# Patient Record
Sex: Female | Born: 1940 | Race: White | Hispanic: No | Marital: Married | Smoking: Never smoker
Health system: Southern US, Community
[De-identification: ages and names within clinical notes are randomized; demographics above are authoritative.]

## PROBLEM LIST (undated history)

## (undated) DIAGNOSIS — F329 Major depressive disorder, single episode, unspecified: Secondary | ICD-10-CM

## (undated) DIAGNOSIS — F32A Depression, unspecified: Secondary | ICD-10-CM

## (undated) DIAGNOSIS — B019 Varicella without complication: Secondary | ICD-10-CM

## (undated) DIAGNOSIS — G4700 Insomnia, unspecified: Secondary | ICD-10-CM

## (undated) DIAGNOSIS — J45909 Unspecified asthma, uncomplicated: Secondary | ICD-10-CM

## (undated) DIAGNOSIS — I10 Essential (primary) hypertension: Secondary | ICD-10-CM

## (undated) DIAGNOSIS — F419 Anxiety disorder, unspecified: Secondary | ICD-10-CM

## (undated) DIAGNOSIS — T7840XA Allergy, unspecified, initial encounter: Secondary | ICD-10-CM

## (undated) DIAGNOSIS — K5190 Ulcerative colitis, unspecified, without complications: Secondary | ICD-10-CM

## (undated) HISTORY — DX: Varicella without complication: B01.9

## (undated) HISTORY — DX: Allergy, unspecified, initial encounter: T78.40XA

## (undated) HISTORY — PX: REPLACEMENT UNICONDYLAR JOINT KNEE: SUR1227

## (undated) HISTORY — DX: Essential (primary) hypertension: I10

## (undated) HISTORY — DX: Unspecified asthma, uncomplicated: J45.909

## (undated) HISTORY — DX: Ulcerative colitis, unspecified, without complications: K51.90

---

## 1998-07-01 HISTORY — PX: ABDOMINAL HYSTERECTOMY: SHX81

## 2011-07-02 HISTORY — PX: JOINT REPLACEMENT: SHX530

## 2013-08-09 DIAGNOSIS — J45901 Unspecified asthma with (acute) exacerbation: Secondary | ICD-10-CM | POA: Diagnosis not present

## 2013-09-06 DIAGNOSIS — F411 Generalized anxiety disorder: Secondary | ICD-10-CM | POA: Diagnosis not present

## 2013-09-06 DIAGNOSIS — K5289 Other specified noninfective gastroenteritis and colitis: Secondary | ICD-10-CM | POA: Diagnosis not present

## 2013-12-22 DIAGNOSIS — K5190 Ulcerative colitis, unspecified, without complications: Secondary | ICD-10-CM | POA: Diagnosis not present

## 2013-12-22 DIAGNOSIS — K5289 Other specified noninfective gastroenteritis and colitis: Secondary | ICD-10-CM | POA: Diagnosis not present

## 2013-12-22 DIAGNOSIS — K644 Residual hemorrhoidal skin tags: Secondary | ICD-10-CM | POA: Diagnosis not present

## 2013-12-22 DIAGNOSIS — I10 Essential (primary) hypertension: Secondary | ICD-10-CM | POA: Diagnosis not present

## 2014-01-26 DIAGNOSIS — K5190 Ulcerative colitis, unspecified, without complications: Secondary | ICD-10-CM | POA: Diagnosis not present

## 2014-02-09 DIAGNOSIS — K633 Ulcer of intestine: Secondary | ICD-10-CM | POA: Diagnosis not present

## 2014-02-09 DIAGNOSIS — K5100 Ulcerative (chronic) pancolitis without complications: Secondary | ICD-10-CM | POA: Diagnosis not present

## 2014-02-09 DIAGNOSIS — K5190 Ulcerative colitis, unspecified, without complications: Secondary | ICD-10-CM | POA: Diagnosis not present

## 2014-02-09 DIAGNOSIS — K5150 Left sided colitis without complications: Secondary | ICD-10-CM | POA: Diagnosis not present

## 2014-02-09 DIAGNOSIS — K920 Hematemesis: Secondary | ICD-10-CM | POA: Diagnosis not present

## 2014-02-22 DIAGNOSIS — K5190 Ulcerative colitis, unspecified, without complications: Secondary | ICD-10-CM | POA: Diagnosis not present

## 2014-02-22 DIAGNOSIS — F3289 Other specified depressive episodes: Secondary | ICD-10-CM | POA: Diagnosis not present

## 2014-02-22 DIAGNOSIS — F329 Major depressive disorder, single episode, unspecified: Secondary | ICD-10-CM | POA: Diagnosis not present

## 2014-03-04 DIAGNOSIS — I10 Essential (primary) hypertension: Secondary | ICD-10-CM | POA: Diagnosis not present

## 2014-03-04 DIAGNOSIS — K5190 Ulcerative colitis, unspecified, without complications: Secondary | ICD-10-CM | POA: Diagnosis not present

## 2014-03-16 DIAGNOSIS — K5190 Ulcerative colitis, unspecified, without complications: Secondary | ICD-10-CM | POA: Diagnosis not present

## 2014-03-29 DIAGNOSIS — R21 Rash and other nonspecific skin eruption: Secondary | ICD-10-CM | POA: Diagnosis not present

## 2014-05-04 DIAGNOSIS — F331 Major depressive disorder, recurrent, moderate: Secondary | ICD-10-CM | POA: Diagnosis not present

## 2014-05-17 DIAGNOSIS — K51919 Ulcerative colitis, unspecified with unspecified complications: Secondary | ICD-10-CM | POA: Diagnosis not present

## 2014-06-07 DIAGNOSIS — I10 Essential (primary) hypertension: Secondary | ICD-10-CM | POA: Diagnosis present

## 2014-06-07 DIAGNOSIS — R092 Respiratory arrest: Secondary | ICD-10-CM | POA: Diagnosis not present

## 2014-06-07 DIAGNOSIS — K5190 Ulcerative colitis, unspecified, without complications: Secondary | ICD-10-CM | POA: Diagnosis present

## 2014-06-07 DIAGNOSIS — J4552 Severe persistent asthma with status asthmaticus: Secondary | ICD-10-CM | POA: Diagnosis not present

## 2014-06-07 DIAGNOSIS — N390 Urinary tract infection, site not specified: Secondary | ICD-10-CM | POA: Diagnosis not present

## 2014-06-07 DIAGNOSIS — E872 Acidosis: Secondary | ICD-10-CM | POA: Diagnosis not present

## 2014-06-07 DIAGNOSIS — J45902 Unspecified asthma with status asthmaticus: Secondary | ICD-10-CM | POA: Diagnosis not present

## 2014-06-07 DIAGNOSIS — E860 Dehydration: Secondary | ICD-10-CM | POA: Diagnosis present

## 2014-06-14 DIAGNOSIS — J45909 Unspecified asthma, uncomplicated: Secondary | ICD-10-CM | POA: Diagnosis not present

## 2014-06-15 DIAGNOSIS — Z09 Encounter for follow-up examination after completed treatment for conditions other than malignant neoplasm: Secondary | ICD-10-CM | POA: Diagnosis not present

## 2014-06-15 DIAGNOSIS — J45909 Unspecified asthma, uncomplicated: Secondary | ICD-10-CM | POA: Diagnosis not present

## 2014-08-25 DIAGNOSIS — I10 Essential (primary) hypertension: Secondary | ICD-10-CM | POA: Diagnosis not present

## 2014-08-25 DIAGNOSIS — J45909 Unspecified asthma, uncomplicated: Secondary | ICD-10-CM | POA: Diagnosis not present

## 2014-09-26 DIAGNOSIS — J45901 Unspecified asthma with (acute) exacerbation: Secondary | ICD-10-CM | POA: Diagnosis not present

## 2014-09-26 DIAGNOSIS — J069 Acute upper respiratory infection, unspecified: Secondary | ICD-10-CM | POA: Diagnosis not present

## 2014-10-11 DIAGNOSIS — J45909 Unspecified asthma, uncomplicated: Secondary | ICD-10-CM | POA: Diagnosis not present

## 2014-12-06 ENCOUNTER — Encounter: Payer: Self-pay | Admitting: Primary Care

## 2014-12-06 ENCOUNTER — Ambulatory Visit (INDEPENDENT_AMBULATORY_CARE_PROVIDER_SITE_OTHER): Payer: Medicare Other | Admitting: Primary Care

## 2014-12-06 VITALS — BP 148/88 | HR 64 | Temp 98.3°F | Ht 62.5 in | Wt 122.4 lb

## 2014-12-06 DIAGNOSIS — M62838 Other muscle spasm: Secondary | ICD-10-CM

## 2014-12-06 DIAGNOSIS — M6248 Contracture of muscle, other site: Secondary | ICD-10-CM | POA: Diagnosis not present

## 2014-12-06 DIAGNOSIS — K5190 Ulcerative colitis, unspecified, without complications: Secondary | ICD-10-CM

## 2014-12-06 DIAGNOSIS — M542 Cervicalgia: Secondary | ICD-10-CM

## 2014-12-06 DIAGNOSIS — I10 Essential (primary) hypertension: Secondary | ICD-10-CM

## 2014-12-06 DIAGNOSIS — J45909 Unspecified asthma, uncomplicated: Secondary | ICD-10-CM

## 2014-12-06 DIAGNOSIS — G8929 Other chronic pain: Secondary | ICD-10-CM | POA: Insufficient documentation

## 2014-12-06 MED ORDER — TIZANIDINE HCL 4 MG PO TABS: 4.0000 mg | ORAL_TABLET | Freq: Four times a day (QID) | ORAL | Status: DC | PRN

## 2014-12-06 NOTE — Progress Notes (Signed)
Subjective:    Patient ID: Katelyn Dillon, female    DOB: 1940-09-21, 74 y.o.   MRN: [id number]  HPI  Mr. Katelyn Dillon is a 74 year old female who presents today to establish care and discuss the problems mentioned below. Will obtain old records.  1) Asthma: Diagnosed during childhood. She has an albuterol inhaler that she's used 2-3 times in the past 3 weeks and will do nebulized treatments twice daily as directed per prior pulmonologist (cannot remember names of meds). She will typically feel shortness of breath without wheezing. She had pulmonary function testing in March 2016 and reports results were stable without change. Will obtain those records.  2) Hypertension: Diagnosed years ago. She is managed on amlodipine-benazepril 10-20 mg daily. She reports BP check in March was normal. Denies chest pain, headaches, shortness of breath. She reports feeling anxious sometimes in doctors offices.  3) Ulcerative Colitis: Diagnosed 30+ years ago and is managed on mesalamine 1.2 tablets. Has colonoscopy's every 2-3 years. Last one was 2 years ago. Denies recent flare-up's.   Review of Systems  Constitutional: Negative for unexpected weight change.  HENT: Negative for rhinorrhea.   Respiratory: Negative for cough and shortness of breath.   Cardiovascular: Negative for chest pain.  Gastrointestinal: Negative for diarrhea and constipation.  Genitourinary: Negative for dysuria and frequency.  Skin: Negative for rash.  Allergic/Immunologic: Positive for environmental allergies.  Neurological: Negative for headaches.  Psychiatric/Behavioral:       Denies concerns for anxiety or depression.       Past Medical History  Diagnosis Date  . Asthma   . Chicken pox   . Allergy   . Hypertension   . Ulcerative colitis     History   Social History  . Marital Status: Single    Spouse Name: N/A  . Number of Children: N/A  . Years of Education: N/A   Occupational History  . Not on file.   Social  History Main Topics  . Smoking status: Never Smoker   . Smokeless tobacco: Not on file  . Alcohol Use: 0.0 oz/week    0 Standard drinks or equivalent per week     Comment: social  . Drug Use: Not on file  . Sexual Activity: Not on file   Other Topics Concern  . Not on file   Social History Narrative   Widowed. In a relationship.   Three children.   Recently moved from [place].   Enjoys working outdoors, sewing.    Past Surgical History  Procedure Laterality Date  . Abdominal hysterectomy  2000    Family History  Problem Relation Age of Onset  . Hypertension Mother   . Hypertension Father     No Known Allergies  No current outpatient prescriptions on file prior to visit.   No current facility-administered medications on file prior to visit.    BP 148/88 mmHg  Pulse 64  Temp(Src) 98.3 F (36.8 C) (Oral)  Ht 5' 2.5" (1.588 m)  Wt 122 lb 6.4 oz (55.52 kg)  BMI 22.02 kg/m2  SpO2 98%    Objective:   Physical Exam  Constitutional: She is oriented to person, place, and time. She appears well-nourished.  Cardiovascular: Normal rate and regular rhythm.   Pulmonary/Chest: Effort normal and breath sounds normal.  Abdominal: Soft. Bowel sounds are normal. There is no tenderness.  Neurological: She is alert and oriented to person, place, and time.  Skin: Skin is warm and dry.  Psychiatric: She has a normal  mood and affect.          Assessment & Plan:

## 2014-12-06 NOTE — Assessment & Plan Note (Signed)
Stable. Managed on mesalamine 1.2 mg tablets No recent flare-up's. Due for repeat colonoscopy per patient, will obtain old records and review.

## 2014-12-06 NOTE — Assessment & Plan Note (Signed)
Managed on Lotrel 10/20 mg. Stable in office today per guidelines. She reports her pressures are typically lower but she's been going through stress recently with the move from [place]. Will continue to monitor.

## 2014-12-06 NOTE — Assessment & Plan Note (Signed)
Present since neck injury years ago. Uses muscle relaxer's sparingly PRN. Refills provided with drowsiness precautions.

## 2014-12-06 NOTE — Patient Instructions (Signed)
Please schedule a physical with me in mid August. You will also schedule a lab only appointment one week prior. We will discuss your lab results during your physical. It was a pleasure to meet you today! Please don't hesitate to call me with any questions. Welcome to [facility]!

## 2014-12-06 NOTE — Progress Notes (Signed)
Pre visit review using our clinic review tool, if applicable. No additional management support is needed unless otherwise documented below in the visit note. 

## 2014-12-06 NOTE — Assessment & Plan Note (Signed)
Present since childhood. Managed on neb treatments which she does twice daily (cannot remember meds) and albuterol inhaler PRN. Last PFT's were in March 2016, will obtain records. Will continue to monitor.

## 2015-01-09 ENCOUNTER — Telehealth: Payer: Self-pay

## 2015-01-09 NOTE — Telephone Encounter (Signed)
Received faxed paperwork and am happy to complete; however, there is no documentation of the exact nebulized medication. Vallarie Mare, will you find out what she's using at home? Thanks.

## 2015-01-09 NOTE — Telephone Encounter (Signed)
Watkins left v/m requesting status of faxed request for neb solution requested on 01/08/15. [facility] request cb [phone].

## 2015-01-09 NOTE — Telephone Encounter (Signed)
Found the faxed paperwork. Placed in [place] for completion. Will fax when she is finish.

## 2015-01-10 ENCOUNTER — Telehealth: Payer: Self-pay | Admitting: Primary Care

## 2015-01-10 NOTE — Telephone Encounter (Signed)
Form completed. Placed in [place] for faxing.

## 2015-01-10 NOTE — Telephone Encounter (Signed)
Called and spoken to patient. Patient is taking the neb for her asthma and BID. Patient takes Brovana 54mg/2ml and ipratropium bromide 0.513m

## 2015-01-11 ENCOUNTER — Ambulatory Visit: Payer: Self-pay | Admitting: Nurse Practitioner

## 2015-01-23 ENCOUNTER — Telehealth: Payer: Self-pay

## 2015-01-23 NOTE — Telephone Encounter (Signed)
Called and spoke with patient, and notified them that they were due for a Mammogram. Patient states that she is leaving town until the middle of August, and has a physical shortly after she returns home. Patietn states that she will schedule an appointment after her physical.

## 2015-01-25 ENCOUNTER — Other Ambulatory Visit: Payer: Self-pay | Admitting: Primary Care

## 2015-01-25 NOTE — Telephone Encounter (Signed)
Refill request. Last prescribed  7 weeks ago on 12/06/2014  tizanidine (ZANAFLEX) 4 MG tablet Dispense: 30 tablet   Refills: 2    There is also a note that stated CYCLE FILL MEDICATION. Authorization is required for next refill.

## 2015-02-02 ENCOUNTER — Other Ambulatory Visit: Payer: Self-pay | Admitting: Primary Care

## 2015-02-02 DIAGNOSIS — Z0000 Encounter for general adult medical examination without abnormal findings: Secondary | ICD-10-CM

## 2015-02-02 DIAGNOSIS — Z131 Encounter for screening for diabetes mellitus: Secondary | ICD-10-CM

## 2015-02-02 DIAGNOSIS — Z13220 Encounter for screening for lipoid disorders: Secondary | ICD-10-CM

## 2015-02-02 DIAGNOSIS — E2839 Other primary ovarian failure: Secondary | ICD-10-CM

## 2015-02-02 DIAGNOSIS — Z1329 Encounter for screening for other suspected endocrine disorder: Secondary | ICD-10-CM

## 2015-02-02 DIAGNOSIS — Z13820 Encounter for screening for osteoporosis: Secondary | ICD-10-CM

## 2015-02-02 DIAGNOSIS — I10 Essential (primary) hypertension: Secondary | ICD-10-CM

## 2015-02-04 ENCOUNTER — Other Ambulatory Visit: Payer: Self-pay | Admitting: Primary Care

## 2015-02-06 ENCOUNTER — Other Ambulatory Visit (INDEPENDENT_AMBULATORY_CARE_PROVIDER_SITE_OTHER): Payer: Medicare Other

## 2015-02-06 ENCOUNTER — Telehealth: Payer: Self-pay | Admitting: Primary Care

## 2015-02-06 DIAGNOSIS — I10 Essential (primary) hypertension: Secondary | ICD-10-CM | POA: Diagnosis not present

## 2015-02-06 LAB — COMPREHENSIVE METABOLIC PANEL
ALT: 11 U/L (ref 0–35)
AST: 16 U/L (ref 0–37)
Albumin: 4.2 g/dL (ref 3.5–5.2)
Alkaline Phosphatase: 117 U/L (ref 39–117)
BUN: 16 mg/dL (ref 6–23)
CO2: 27 meq/L (ref 19–32)
CREATININE: 0.72 mg/dL (ref 0.40–1.20)
Calcium: 9.7 mg/dL (ref 8.4–10.5)
Chloride: 105 mEq/L (ref 96–112)
GFR: 84.53 mL/min (ref >60.00)
GLUCOSE: 91 mg/dL (ref 70–99)
Potassium: 4.1 mEq/L (ref 3.5–5.1)
Sodium: 139 mEq/L (ref 135–145)
Total Bilirubin: 0.8 mg/dL (ref 0.2–1.2)
Total Protein: 7 g/dL (ref 6.0–8.3)

## 2015-02-06 LAB — LIPID PANEL
CHOL/HDL RATIO: 3
Cholesterol: 236 mg/dL — ABNORMAL HIGH (ref 0–200)
HDL: 80.5 mg/dL (ref >39.00)
LDL Cholesterol: 126 mg/dL — ABNORMAL HIGH (ref 0–99)
NONHDL: 155.92
Triglycerides: 149 mg/dL (ref 0.0–149.0)
VLDL: 29.8 mg/dL (ref 0.0–40.0)

## 2015-02-06 LAB — CBC
HEMATOCRIT: 42 % (ref 36.0–46.0)
Hemoglobin: 13.6 g/dL (ref 12.0–15.0)
MCHC: 32.3 g/dL (ref 30.0–36.0)
MCV: 87.4 fl (ref 78.0–100.0)
PLATELETS: 376 10*3/uL (ref 150.0–400.0)
RBC: 4.81 Mil/uL (ref 3.87–5.11)
RDW: 15.8 % — ABNORMAL HIGH (ref 11.5–15.5)
WBC: 7.8 10*3/uL (ref 4.0–10.5)

## 2015-02-06 NOTE — Telephone Encounter (Signed)
Pt came in for her cpe labs and filled out Triage form. She is having a flare up of ulcer colitis and would like prednisone called in.  [facility].  Triage form that was completed by pt in [place].

## 2015-02-06 NOTE — Telephone Encounter (Signed)
Refuse? It was just filled on 01/25/15. tizanidine 4 MG tablet   Dispense: 30 tablet   Refills: 0

## 2015-02-06 NOTE — Telephone Encounter (Signed)
I will need to evaluate her in the office and am happy to see her. She may either make an appointment with me or I can send her to a GI specialist.  Thanks.

## 2015-02-07 ENCOUNTER — Encounter: Payer: Self-pay | Admitting: Primary Care

## 2015-02-07 ENCOUNTER — Ambulatory Visit (INDEPENDENT_AMBULATORY_CARE_PROVIDER_SITE_OTHER): Payer: Medicare Other | Admitting: Primary Care

## 2015-02-07 VITALS — BP 144/82 | HR 70 | Temp 98.0°F | Ht 62.5 in | Wt 125.8 lb

## 2015-02-07 DIAGNOSIS — F419 Anxiety disorder, unspecified: Secondary | ICD-10-CM

## 2015-02-07 DIAGNOSIS — F32A Depression, unspecified: Secondary | ICD-10-CM | POA: Insufficient documentation

## 2015-02-07 DIAGNOSIS — F418 Other specified anxiety disorders: Secondary | ICD-10-CM | POA: Diagnosis not present

## 2015-02-07 DIAGNOSIS — K51919 Ulcerative colitis, unspecified with unspecified complications: Secondary | ICD-10-CM

## 2015-02-07 DIAGNOSIS — F329 Major depressive disorder, single episode, unspecified: Secondary | ICD-10-CM

## 2015-02-07 MED ORDER — SERTRALINE HCL 50 MG PO TABS: 50.0000 mg | ORAL_TABLET | Freq: Every day | ORAL | Status: DC

## 2015-02-07 MED ORDER — PREDNISONE 20 MG PO TABS: ORAL_TABLET | ORAL | Status: DC

## 2015-02-07 NOTE — Telephone Encounter (Signed)
Called and notified patient of [name]'s comments. Patient verbalized understanding. Patient has apt on 02/07/15

## 2015-02-07 NOTE — Progress Notes (Signed)
Subjective:    Patient ID: Katelyn Dillon, female    DOB: [birth year], 74 y.o.   MRN: [id number]  HPI  [name] is a 74 year old female who presents today with a chief complaint of ulcerative colitis flare-up. She has a longstanding history of this and was managed on prednisone for acute flare ups. Her pain began 2 weeks ago. She's recently been under a lot of stress over the past week. Several days ago she started noticing intermittent bloody stools with mucous. Pain is worse with eating and drinking. She was managed by GI in [place] and is taking the mesalamine. Last colonoscopy per patient was in 2015 without polyps.   2) GAD: Diagnosed three years ago. She was once treated with zoloft, wellbutrin, and ativan prn. She reports daily worry, difficulty to control her worry, difficulty sleeping, daytime tiredness. She also reports daily sadness, has no pleasure in doing things. Denies SI/HI. PHQ-9 score of 13 today.  Review of Systems  Constitutional: Negative for fever and chills.  Respiratory: Negative for shortness of breath.   Cardiovascular: Negative for chest pain.  Gastrointestinal: Positive for abdominal pain and blood in stool. Negative for diarrhea.  Psychiatric/Behavioral: Negative for suicidal ideas. The patient is nervous/anxious.        Past Medical History  Diagnosis Date  . Asthma   . Chicken pox   . Allergy   . Hypertension   . Ulcerative colitis     History   Social History  . Marital Status: Single    Spouse Name: N/A  . Number of Children: N/A  . Years of Education: N/A   Occupational History  . Not on file.   Social History Main Topics  . Smoking status: Never Smoker   . Smokeless tobacco: Not on file  . Alcohol Use: 0.0 oz/week    0 Standard drinks or equivalent per week     Comment: social  . Drug Use: Not on file  . Sexual Activity: Not on file   Other Topics Concern  . Not on file   Social History Narrative   Widowed. In a relationship.   Three children.   Recently moved from [place].   Enjoys working outdoors, sewing.    Past Surgical History  Procedure Laterality Date  . Abdominal hysterectomy  2000    Family History  Problem Relation Age of Onset  . Hypertension Mother   . Hypertension Father     No Known Allergies  Current Outpatient Prescriptions on File Prior to Visit  Medication Sig Dispense Refill  . amLODipine-benazepril (LOTREL) 10-20 MG per capsule Take 1 capsule by mouth daily.    . mesalamine (LIALDA) 1.2 G EC tablet Take 1.2 g by mouth daily with breakfast.    . tiZANidine (ZANAFLEX) 4 MG tablet TAKE 1 TABLET EVERY 6 HOURS AS NEEDED FOR MUSCLE SPASMS 30 tablet 0   No current facility-administered medications on file prior to visit.    BP 144/82 mmHg  Pulse 70  Temp(Src) 98 F (36.7 C) (Oral)  Ht 5' 2.5" (1.588 m)  Wt 125 lb 12.8 oz (57.063 kg)  BMI 22.63 kg/m2  SpO2 97%    Objective:   Physical Exam  Constitutional: She appears well-nourished.  Cardiovascular: Normal rate and regular rhythm.   Pulmonary/Chest: Effort normal and breath sounds normal.  Abdominal: Soft. Normal appearance. Bowel sounds are increased. There is tenderness in the suprapubic area and left lower quadrant.  Skin: Skin is warm and dry.  Psychiatric: She  has a normal mood and affect.          Assessment & Plan:

## 2015-02-07 NOTE — Assessment & Plan Note (Signed)
Current flare for past 2 weeks.  Bloody, mucous stools. She's undergone a lot of stress recently. RX for prednisone taper.  Will follow up next week during physical.

## 2015-02-07 NOTE — Assessment & Plan Note (Signed)
Meets criteria for GAD diagnosis, PHQ9 score of 13 today. Was once treated with zoloft, wellbutrin, ativan. Will start Zoloft 50 mg with 1/2 tab daily for 6 days, then 1 tab thereafter. Discussed possible side effects. Will follow up and continue to monitor.

## 2015-02-07 NOTE — Progress Notes (Signed)
Pre visit review using our clinic review tool, if applicable. No additional management support is needed unless otherwise documented below in the visit note. 

## 2015-02-07 NOTE — Patient Instructions (Signed)
Start prednisone for ulcerative colitis flare-up. Take 3 tablets daily for 4 days, then 2 tablets for 4 days, then 1 tablet for 4 days.  Start Zoloft (sertraline) for anxiety and depression. Take 1/2 tablet by mouth daily for 6 days then advance to 1 full tablet thereafter.   Follow up as scheduled next week.  It was a pleasure to see you today!

## 2015-02-14 ENCOUNTER — Encounter: Payer: Medicare Other | Admitting: Primary Care

## 2015-02-15 ENCOUNTER — Other Ambulatory Visit: Payer: Self-pay | Admitting: Primary Care

## 2015-02-15 ENCOUNTER — Encounter: Payer: Self-pay | Admitting: Primary Care

## 2015-02-15 ENCOUNTER — Ambulatory Visit (INDEPENDENT_AMBULATORY_CARE_PROVIDER_SITE_OTHER): Payer: Medicare Other | Admitting: Primary Care

## 2015-02-15 VITALS — BP 120/72 | HR 70 | Temp 97.8°F | Ht 63.0 in | Wt 125.8 lb

## 2015-02-15 DIAGNOSIS — Z23 Encounter for immunization: Secondary | ICD-10-CM

## 2015-02-15 DIAGNOSIS — Z0000 Encounter for general adult medical examination without abnormal findings: Secondary | ICD-10-CM | POA: Diagnosis not present

## 2015-02-15 DIAGNOSIS — K51919 Ulcerative colitis, unspecified with unspecified complications: Secondary | ICD-10-CM | POA: Diagnosis not present

## 2015-02-15 DIAGNOSIS — E2839 Other primary ovarian failure: Secondary | ICD-10-CM | POA: Diagnosis not present

## 2015-02-15 DIAGNOSIS — K5190 Ulcerative colitis, unspecified, without complications: Secondary | ICD-10-CM | POA: Diagnosis not present

## 2015-02-15 DIAGNOSIS — F418 Other specified anxiety disorders: Secondary | ICD-10-CM

## 2015-02-15 DIAGNOSIS — F419 Anxiety disorder, unspecified: Secondary | ICD-10-CM

## 2015-02-15 DIAGNOSIS — Z1231 Encounter for screening mammogram for malignant neoplasm of breast: Secondary | ICD-10-CM

## 2015-02-15 DIAGNOSIS — F329 Major depressive disorder, single episode, unspecified: Secondary | ICD-10-CM

## 2015-02-15 MED ORDER — PREDNISONE 20 MG PO TABS: ORAL_TABLET | ORAL | Status: DC

## 2015-02-15 MED ORDER — ZOSTER VACCINE LIVE 19400 UNT/0.65ML ~~LOC~~ SOLR: 0.6500 mL | Freq: Once | SUBCUTANEOUS | Status: DC

## 2015-02-15 NOTE — Patient Instructions (Addendum)
Start taking daily aspirin 81 mg to help prevent any buildup in your arteries.  Work to increase consumption of fruits and vegetables. Limit fried/fatty foods.  You've been provided with a tetanus and shingles vaccine today.  Please schedule an eye exam, dental exam, and mammogram before the end of the year.  You will be contacted regarding your referral for Bone Density testing.  Please let us know if you have not heard back within one week.   Follow up in 2 months for re-evaluation of depression.  It was a pleasure to see you today!

## 2015-02-15 NOTE — Progress Notes (Signed)
Pre visit review using our clinic review tool, if applicable. No additional management support is needed unless otherwise documented below in the visit note. 

## 2015-02-15 NOTE — Progress Notes (Signed)
Patient ID: Katelyn Dillon, female   DOB: 16-Feb-1941, 74 y.o.   MRN: [id number]  HPI: [name] is a 74 year old female who presents today for Medicare Wellness Visit, Subsequent.  Past Medical History  Diagnosis Date  . Asthma   . Chicken pox   . Allergy   . Hypertension   . Ulcerative colitis     Current Outpatient Prescriptions  Medication Sig Dispense Refill  . amLODipine-benazepril (LOTREL) 10-20 MG per capsule Take 1 capsule by mouth daily.    . mesalamine (LIALDA) 1.2 G EC tablet Take 1.2 g by mouth daily with breakfast.    . predniSONE (DELTASONE) 20 MG tablet Take 3 tablets by mouth daily for 4 days, then 2 tablets by mouth daily for 4 days, then 1 tablet by mouth daily for 4 days. 24 tablet 0  . sertraline (ZOLOFT) 50 MG tablet Take 1 tablet (50 mg total) by mouth daily. 30 tablet 3  . tiZANidine (ZANAFLEX) 4 MG tablet TAKE 1 TABLET EVERY 6 HOURS AS NEEDED FOR MUSCLE SPASMS 30 tablet 0   No current facility-administered medications for this visit.    No Known Allergies  Family History  Problem Relation Age of Onset  . Hypertension Mother   . Hypertension Father     Social History   Social History  . Marital Status: Single    Spouse Name: N/A  . Number of Children: N/A  . Years of Education: N/A   Occupational History  . Not on file.   Social History Main Topics  . Smoking status: Never Smoker   . Smokeless tobacco: Not on file  . Alcohol Use: 0.0 oz/week    0 Standard drinks or equivalent per week     Comment: social  . Drug Use: Not on file  . Sexual Activity: Not on file   Other Topics Concern  . Not on file   Social History Narrative   Widowed. In a relationship.   Three children.   Recently moved from [place].   Enjoys working outdoors, sewing.    Hospitiliaztions: None in 2016.  Health Maintenance:    Flu: Completed last season.  Tetanus: Completed 10 years ago.  Pneumovax: Completed  Prevnar: Completed  Zostavax: Has never  completed  Bone Density: Has not completed in the past 5+ years  Colonoscopy: Completed in 2013, shows UC, Competed in 2015. Due in 2017  Eye Doctor: She is working to find an eye doctor  Dental Exam: She is working to find a Pharmacist, community  Mammogram: Has not completed in years      Providers: Alma Friendly, PCP   I have personally reviewed and have noted: 1. The patient's medical and social history 2. Their use of alcohol, tobacco or illicit drugs 3. Their current medications and supplements 4. The patient's functional ability including ADL's, fall risks, home safety risks and hearing or visual  impairment. 5. Diet and physical activities 6. Evidence for depression or mood disorder  Subjective:   Review of Systems:   Constitutional: Denies fever, malaise, fatigue, headache or abrupt weight changes.  HEENT: Denies eye pain, eye redness, ear pain, ringing in the ears, wax buildup, runny nose, nasal congestion, bloody nose, or sore throat. She endorses difficulty hearing.  Respiratory: Denies difficulty breathing, shortness of breath, cough or sputum production.   Cardiovascular: Denies chest pain, chest tightness, palpitations or swelling in the hands or feet.  Gastrointestinal: Denies abdominal pain, bloating, constipation, diarrhea or blood in the stool.  GU: Denies  urgency, frequency, pain with urination, burning sensation, blood in urine, odor or discharge. Musculoskeletal: Denies decrease in range of motion, difficulty with gait, muscle pain or joint pain and swelling.  Skin: Denies redness, rashes, lesions or ulcercations.  Neurological: Denies dizziness, difficulty with memory, difficulty with speech or problems with balance and coordination.   No other specific complaints in a complete review of systems (except as listed in HPI above).  Objective:  PE:   There were no vitals taken for this visit. Wt Readings from Last 3 Encounters:  02/07/15 125 lb 12.8 oz (57.063 kg)   12/06/14 122 lb 6.4 oz (55.52 kg)    General: Appears their stated age, well developed, well nourished in NAD. Skin: Warm, dry and intact. No rashes, lesions or ulcerations noted. HEENT: Head: normal shape and size; Eyes: sclera white, no icterus, conjunctiva pink, PERRLA and EOMs intact; Ears: Tm's gray and intact, normal light reflex; Nose: mucosa pink and moist, septum midline; Throat/Mouth: Teeth present, mucosa pink and moist, no exudate, lesions or ulcerations noted.  Neck: Normal range of motion. Neck supple, trachea midline. No massses, lumps or thyromegaly present.  Cardiovascular: Normal rate and rhythm. S1,S2 noted.  No murmur, rubs or gallops noted. No JVD or BLE edema. No carotid bruits noted. Pulmonary/Chest: Normal effort and positive vesicular breath sounds. No respiratory distress. No wheezes, rales or ronchi noted.  Abdomen: Soft and slightly tender to LLQ and suprapubic region from healing ulcerative collitis. Normal bowel sounds, no bruits noted. No distention or masses noted. Liver, spleen and kidneys non palpable. Musculoskeletal: Normal range of motion. No signs of joint swelling. No difficulty with gait.  Neurological: Alert and oriented. Cranial nerves II-XII intact. Coordination normal. +DTRs bilaterally. Psychiatric: Mood and affect normal. Behavior is normal. Judgment and thought content normal. Improved since starting Zoloft 50 mg.  EKG: Completed today. Sinus rhythm, rate of 61, No ST-elevation. History of cardiac arrest in December 2015. Will continue to monitor. Asymptomatic today.  BMET    Component Value Date/Time   NA 139 02/06/2015 1038   K 4.1 02/06/2015 1038   CL 105 02/06/2015 1038   CO2 27 02/06/2015 1038   GLUCOSE 91 02/06/2015 1038   BUN 16 02/06/2015 1038   CREATININE 0.72 02/06/2015 1038   CALCIUM 9.7 02/06/2015 1038    Lipid Panel     Component Value Date/Time   CHOL 236* 02/06/2015 1038   TRIG 149.0 02/06/2015 1038   HDL 80.50  02/06/2015 1038   CHOLHDL 3 02/06/2015 1038   VLDL 29.8 02/06/2015 1038   LDLCALC 126* 02/06/2015 1038    CBC    Component Value Date/Time   WBC 7.8 02/06/2015 1038   RBC 4.81 02/06/2015 1038   HGB 13.6 02/06/2015 1038   HCT 42.0 02/06/2015 1038   PLT 376.0 02/06/2015 1038   MCV 87.4 02/06/2015 1038   MCHC 32.3 02/06/2015 1038   RDW 15.8* 02/06/2015 1038    Hgb A1C No results found for: HGBA1C    Assessment and Plan:   Medicare Annual Wellness Visit:  Diet:  Breakfast: Skips Lunch: Pizza, omlette, restaurants  Dinner: Restaurants Beverages: Red wine, Unsweet tea, some water Physical activity: Active lifestyle, does not currently exercise. Depression/mood screen: Currently being treated. Hearing: Intact to whispered voice Visual acuity: Grossly normal, performs annual eye exam  ADLs: Capable Fall risk: None Home safety: Good Cognitive evaluation: Intact to orientation, naming, recall and repetition EOL planning: Adv directives completed, living will completed, HCPA.  Preventative Medicine: Tdap, Zostavax  administered today. Referral made for Bone Denstiy Testing, She is to schedule her mammogram, eye appointment, and dental exam. Discussed healthy diet and the importance of exercise. Start Asprin 81 mg daily. All recommendations provided to patient upon discharge.  Next appointment: Follow up in 2 months.

## 2015-02-15 NOTE — Assessment & Plan Note (Addendum)
Improved on prednisone. No more bleeding, still experiencing sloughing of mucosal wall. RX provided for future acute flare up. She is to notify me if flare ups occur more frequently.

## 2015-02-15 NOTE — Assessment & Plan Note (Signed)
Improved since initiation. Continue Zoloft 50 mg. Follow up in 2 months for re-evaluation.

## 2015-02-15 NOTE — Assessment & Plan Note (Signed)
Tdap, Zostavax administered today. Referral made for Bone Denstiy Testing, She is to schedule her mammogram, eye appointment, and dental exam. Discussed healthy diet and the importance of exercise. Start Asprin 81 mg daily. All recommendations provided to patient upon discharge. Labs unremarkable, borderline hyperlipidemia. Follow up in 1 year.

## 2015-03-01 ENCOUNTER — Telehealth: Payer: Self-pay | Admitting: Primary Care

## 2015-03-01 DIAGNOSIS — K5190 Ulcerative colitis, unspecified, without complications: Secondary | ICD-10-CM

## 2015-03-01 MED ORDER — MESALAMINE 1.2 G PO TBEC: 3.6000 g | DELAYED_RELEASE_TABLET | Freq: Every day | ORAL | Status: DC

## 2015-03-01 NOTE — Telephone Encounter (Signed)
Will send into pharmacy.

## 2015-03-01 NOTE — Telephone Encounter (Signed)
Received faxed request for medication refill from [facility].  Requesting from pharmacy  Rx for Lialda 1.2 gm  Qty 90    Sig  Take 3 Tablets every morning  Rx have not been prescribed by Anda Kraft.  Patient was last seen on 02/15/15. Next apt on 04/18/15.

## 2015-03-15 ENCOUNTER — Telehealth: Payer: Self-pay | Admitting: Primary Care

## 2015-03-15 ENCOUNTER — Other Ambulatory Visit: Payer: Self-pay | Admitting: Primary Care

## 2015-03-15 DIAGNOSIS — I10 Essential (primary) hypertension: Secondary | ICD-10-CM

## 2015-03-15 MED ORDER — AMLODIPINE BESY-BENAZEPRIL HCL 10-20 MG PO CAPS: 1.0000 | ORAL_CAPSULE | Freq: Every day | ORAL | Status: DC

## 2015-03-15 NOTE — Telephone Encounter (Signed)
Electronically refill request for tizanidine (ZANAFLEX) 4 MG tablet   Dispense: 30 tablet   Refills: 0   Last prescribed on 01/25/2015. Last seen on 02/15/15. Next appointment on 04/04/15.

## 2015-03-15 NOTE — Telephone Encounter (Signed)
Received refill fax request from [facility] for amlodipine-benazepril (LOTREL) 10-20 MG  Capsule  Rx has not been prescribed yet by Anda Kraft. Patient was last seen on 02/15/15. Next appointment 04/18/15.

## 2015-03-15 NOTE — Telephone Encounter (Signed)
Refill sent to pharmacy.   

## 2015-03-29 ENCOUNTER — Encounter: Payer: Self-pay | Admitting: Primary Care

## 2015-03-29 ENCOUNTER — Ambulatory Visit (INDEPENDENT_AMBULATORY_CARE_PROVIDER_SITE_OTHER): Payer: Medicare Other | Admitting: Primary Care

## 2015-03-29 VITALS — BP 132/72 | HR 65 | Temp 97.6°F | Ht 63.0 in | Wt 126.0 lb

## 2015-03-29 DIAGNOSIS — Z23 Encounter for immunization: Secondary | ICD-10-CM

## 2015-03-29 DIAGNOSIS — F418 Other specified anxiety disorders: Secondary | ICD-10-CM

## 2015-03-29 DIAGNOSIS — F419 Anxiety disorder, unspecified: Principal | ICD-10-CM

## 2015-03-29 DIAGNOSIS — F329 Major depressive disorder, single episode, unspecified: Secondary | ICD-10-CM

## 2015-03-29 DIAGNOSIS — F32A Depression, unspecified: Secondary | ICD-10-CM

## 2015-03-29 MED ORDER — VENLAFAXINE HCL ER 37.5 MG PO CP24: 37.5000 mg | ORAL_CAPSULE | Freq: Every day | ORAL | Status: DC

## 2015-03-29 NOTE — Addendum Note (Signed)
Addended by: Jacqualin Combes on: 03/29/2015 10:32 AM   Modules accepted: Orders

## 2015-03-29 NOTE — Patient Instructions (Signed)
Stop taking Sertraline (Zoloft) for anxiety and depression.  Start Venlafaxine (Effexor) XR for anxiety and depression. Take 1 capsule by mouth every morning with a snack.  Follow up in 4 weeks for re-evaluation.  It was a pleasure to see you today!

## 2015-03-29 NOTE — Progress Notes (Signed)
   Subjective:    Patient ID: Katelyn Dillon, female    DOB: [birth year], 74 y.o.   MRN: [id number]  HPI  [name] is a 74 year old female who presents today for follow up of anxiety. She was initiated on Zoloft 50 mg in August 2016 for anxiety and depression. During her last visit she felt improved on this medication so the dose was not adjusted.   Since her last visit she's feeling as though the medication has not helped. She's had difficulty leaving the house, anxiety, decrease in energy, and diarrhea each time she eats which has become a nusance. PHQ 9 score of 22 today in the office. Denies SI/HI.  Review of Systems  Respiratory: Negative for shortness of breath.   Cardiovascular: Negative for chest pain.  Gastrointestinal: Positive for diarrhea. Negative for abdominal pain.  Psychiatric/Behavioral: Positive for sleep disturbance. Negative for suicidal ideas. The patient is nervous/anxious.        Past Medical History  Diagnosis Date  . Asthma   . Chicken pox   . Allergy   . Hypertension   . Ulcerative colitis     Social History   Social History  . Marital Status: Single    Spouse Name: N/A  . Number of Children: N/A  . Years of Education: N/A   Occupational History  . Not on file.   Social History Main Topics  . Smoking status: Never Smoker   . Smokeless tobacco: Not on file  . Alcohol Use: 0.0 oz/week    0 Standard drinks or equivalent per week     Comment: social  . Drug Use: Not on file  . Sexual Activity: Not on file   Other Topics Concern  . Not on file   Social History Narrative   Widowed. In a relationship.   Three children.   Recently moved from [place].   Enjoys working outdoors, sewing.    Past Surgical History  Procedure Laterality Date  . Abdominal hysterectomy  2000    Family History  Problem Relation Age of Onset  . Hypertension Mother   . Hypertension Father     No Known Allergies  Current Outpatient Prescriptions on File Prior  to Visit  Medication Sig Dispense Refill  . amLODipine-benazepril (LOTREL) 10-20 MG per capsule Take 1 capsule by mouth daily. 30 capsule 5  . mesalamine (LIALDA) 1.2 G EC tablet Take 3 tablets (3.6 g total) by mouth daily with breakfast. 90 tablet 5  . predniSONE (DELTASONE) 20 MG tablet Take 3 tablets by mouth daily for 4 days, then 2 tablets by mouth daily for 4 days, then 1 tablet by mouth daily for 4 days. 24 tablet 0  . tiZANidine (ZANAFLEX) 4 MG tablet TAKE 1 TABLET EVERY 6 HOURS AS NEEDED FOR MUSCLE SPASMS 30 tablet 1   No current facility-administered medications on file prior to visit.    BP 132/72 mmHg  Pulse 65  Temp(Src) 97.6 F (36.4 C) (Oral)  Ht 5' 3"  (1.6 m)  Wt 126 lb (57.153 kg)  BMI 22.33 kg/m2  SpO2 99%    Objective:   Physical Exam  Constitutional: She appears well-nourished.  Cardiovascular: Normal rate and regular rhythm.   Pulmonary/Chest: Effort normal and breath sounds normal.  Abdominal: Soft. Bowel sounds are normal. There is no tenderness.  Skin: Skin is warm and dry.  Psychiatric: She has a normal mood and affect.          Assessment & Plan:

## 2015-03-29 NOTE — Assessment & Plan Note (Signed)
No improvement with Zoloft 50 mg, now developing diarrhea each times she eats which is very bothersome. PHQ 9 score of 22 today. Denies SI/HI. Due to diarrhea, will discontinue Zoloft 50 mg and switch to SNRI, start Effexor 37.5 mg XR daily. Follow up in 4 weeks for re-evaluation.

## 2015-03-29 NOTE — Progress Notes (Signed)
Pre visit review using our clinic review tool, if applicable. No additional management support is needed unless otherwise documented below in the visit note. 

## 2015-04-04 ENCOUNTER — Inpatient Hospital Stay: Admission: RE | Admit: 2015-04-04 | Payer: Medicare Other | Source: Ambulatory Visit

## 2015-04-18 ENCOUNTER — Ambulatory Visit: Payer: Medicare Other | Admitting: Primary Care

## 2015-04-27 ENCOUNTER — Ambulatory Visit (INDEPENDENT_AMBULATORY_CARE_PROVIDER_SITE_OTHER): Payer: Medicare Other | Admitting: Primary Care

## 2015-04-27 ENCOUNTER — Encounter: Payer: Self-pay | Admitting: Primary Care

## 2015-04-27 VITALS — BP 118/76 | HR 66 | Temp 97.6°F | Ht 63.0 in | Wt 130.4 lb

## 2015-04-27 DIAGNOSIS — F418 Other specified anxiety disorders: Secondary | ICD-10-CM | POA: Diagnosis not present

## 2015-04-27 DIAGNOSIS — F32A Depression, unspecified: Secondary | ICD-10-CM

## 2015-04-27 DIAGNOSIS — F419 Anxiety disorder, unspecified: Principal | ICD-10-CM

## 2015-04-27 DIAGNOSIS — F329 Major depressive disorder, single episode, unspecified: Secondary | ICD-10-CM

## 2015-04-27 MED ORDER — VENLAFAXINE HCL ER 75 MG PO CP24: 75.0000 mg | ORAL_CAPSULE | Freq: Every day | ORAL | Status: DC

## 2015-04-27 NOTE — Assessment & Plan Note (Signed)
Slight improvement on Effexor XR 37.5, but not quite at goal. Still lacks some motivation and has anxiety about being home along. Will increase dose to 75 mg daily and continue to closely monitor. Follow up in 6 weeks for re-evaluation

## 2015-04-27 NOTE — Progress Notes (Signed)
Subjective:    Patient ID: Katelyn Dillon, female    DOB: [birth year], 74 y.o.   MRN: [id number]  HPI  [name] is a 74 year old female who presents today for follow up of anxiety. Last visit she was discontinued off of the Zoloft 50 mg as she did not feel improvement and also felt GI side effects. She had a PHQ 9 score of 22 that day. She was initiated on Effexor 37.5 mg XR daily.  Since her last visit she's felt a slight improvement on the Effexor 37.5 XL mg. She's noticed an improvement in her mood and motivation, but she's also been on a trip to [place] to see her family. She's very worried about being back home alone as her husband will return to work. She has a lot to do around her home and hopes she will have motivation to do this. Denies SI/HI, headaches, GI upset.   Review of Systems  Respiratory: Negative for shortness of breath.   Cardiovascular: Negative for chest pain.  Neurological: Negative for headaches.       Past Medical History  Diagnosis Date  . Asthma   . Chicken pox   . Allergy   . Hypertension   . Ulcerative colitis [facility])     Social History   Social History  . Marital Status: Single    Spouse Name: N/A  . Number of Children: N/A  . Years of Education: N/A   Occupational History  . Not on file.   Social History Main Topics  . Smoking status: Never Smoker   . Smokeless tobacco: Not on file  . Alcohol Use: 0.0 oz/week    0 Standard drinks or equivalent per week     Comment: social  . Drug Use: Not on file  . Sexual Activity: Not on file   Other Topics Concern  . Not on file   Social History Narrative   Widowed. In a relationship.   Three children.   Recently moved from [place].   Enjoys working outdoors, sewing.    Past Surgical History  Procedure Laterality Date  . Abdominal hysterectomy  2000    Family History  Problem Relation Age of Onset  . Hypertension Mother   . Hypertension Father     No Known Allergies  Current  Outpatient Prescriptions on File Prior to Visit  Medication Sig Dispense Refill  . amLODipine-benazepril (LOTREL) 10-20 MG per capsule Take 1 capsule by mouth daily. 30 capsule 5  . mesalamine (LIALDA) 1.2 G EC tablet Take 3 tablets (3.6 g total) by mouth daily with breakfast. 90 tablet 5  . tiZANidine (ZANAFLEX) 4 MG tablet TAKE 1 TABLET EVERY 6 HOURS AS NEEDED FOR MUSCLE SPASMS 30 tablet 1  . predniSONE (DELTASONE) 20 MG tablet Take 3 tablets by mouth daily for 4 days, then 2 tablets by mouth daily for 4 days, then 1 tablet by mouth daily for 4 days. (Patient not taking: Reported on 04/27/2015) 24 tablet 0   No current facility-administered medications on file prior to visit.    BP 118/76 mmHg  Pulse 66  Temp(Src) 97.6 F (36.4 C) (Oral)  Ht 5' 3"  (1.6 m)  Wt 130 lb 6.4 oz (59.149 kg)  BMI 23.11 kg/m2  SpO2 98%    Objective:   Physical Exam  Constitutional: She appears well-nourished.  Cardiovascular: Normal rate and regular rhythm.   Pulmonary/Chest: Effort normal and breath sounds normal.  Skin: Skin is warm and dry.  Psychiatric: She has  a normal mood and affect.          Assessment & Plan:

## 2015-04-27 NOTE — Progress Notes (Signed)
Pre visit review using our clinic review tool, if applicable. No additional management support is needed unless otherwise documented below in the visit note. 

## 2015-04-27 NOTE — Patient Instructions (Signed)
We will increase your Venlafaxine (Effexor) to 75 mg daily. You may take 2 of the 37.5 mg tablets until your bottle is empty.   Follow up in 6 weeks for re-evaluation.  It was a pleasure to see you today!

## 2015-05-07 ENCOUNTER — Other Ambulatory Visit: Payer: Self-pay | Admitting: Primary Care

## 2015-05-08 NOTE — Telephone Encounter (Signed)
Electronically refill request for   sertraline (ZOLOFT) 50 MG tablet   Take 1 tablet (50 mg total) by mouth daily.  Dispense: 30 tablet   Refills: 3     Last prescribed on  02/07/2015. Last seen on 04/27/2015. Next follow up on 06/08/2015.

## 2015-06-08 ENCOUNTER — Encounter: Payer: Self-pay | Admitting: Primary Care

## 2015-06-08 ENCOUNTER — Ambulatory Visit (INDEPENDENT_AMBULATORY_CARE_PROVIDER_SITE_OTHER): Payer: Medicare Other | Admitting: Primary Care

## 2015-06-08 VITALS — BP 126/70 | HR 76 | Temp 97.7°F | Ht 63.0 in | Wt 131.0 lb

## 2015-06-08 DIAGNOSIS — R062 Wheezing: Secondary | ICD-10-CM

## 2015-06-08 DIAGNOSIS — F418 Other specified anxiety disorders: Secondary | ICD-10-CM | POA: Diagnosis not present

## 2015-06-08 DIAGNOSIS — K5190 Ulcerative colitis, unspecified, without complications: Secondary | ICD-10-CM

## 2015-06-08 DIAGNOSIS — R5383 Other fatigue: Secondary | ICD-10-CM

## 2015-06-08 DIAGNOSIS — R0683 Snoring: Secondary | ICD-10-CM

## 2015-06-08 DIAGNOSIS — R0602 Shortness of breath: Secondary | ICD-10-CM | POA: Diagnosis not present

## 2015-06-08 DIAGNOSIS — F329 Major depressive disorder, single episode, unspecified: Secondary | ICD-10-CM

## 2015-06-08 DIAGNOSIS — G4700 Insomnia, unspecified: Secondary | ICD-10-CM | POA: Insufficient documentation

## 2015-06-08 DIAGNOSIS — J45909 Unspecified asthma, uncomplicated: Secondary | ICD-10-CM

## 2015-06-08 DIAGNOSIS — K51919 Ulcerative colitis, unspecified with unspecified complications: Secondary | ICD-10-CM

## 2015-06-08 DIAGNOSIS — F419 Anxiety disorder, unspecified: Secondary | ICD-10-CM

## 2015-06-08 LAB — CBC
HEMATOCRIT: 41.7 % (ref 36.0–46.0)
Hemoglobin: 13.5 g/dL (ref 12.0–15.0)
MCHC: 32.4 g/dL (ref 30.0–36.0)
MCV: 87.4 fl (ref 78.0–100.0)
Platelets: 361 10*3/uL (ref 150.0–400.0)
RBC: 4.76 Mil/uL (ref 3.87–5.11)
RDW: 15.6 % — ABNORMAL HIGH (ref 11.5–15.5)
WBC: 5.5 10*3/uL (ref 4.0–10.5)

## 2015-06-08 LAB — TSH: TSH: 1.71 u[IU]/mL (ref 0.35–4.50)

## 2015-06-08 MED ORDER — PREDNISONE 20 MG PO TABS: ORAL_TABLET | ORAL | Status: DC

## 2015-06-08 NOTE — Progress Notes (Signed)
Pre visit review using our clinic review tool, if applicable. No additional management support is needed unless otherwise documented below in the visit note. 

## 2015-06-08 NOTE — Assessment & Plan Note (Signed)
Long standing history of. Once trialed on sleeping pills without improvement. Does snore and husband notices periods of apnea. Referral placed to pulmonology for sleep apnea evaluation.  This could be contributing to daytime tiredness.

## 2015-06-08 NOTE — Assessment & Plan Note (Signed)
Recent flare up over thanksgiving.  Printed refill to use PRN if flare up occurs again. Discussed to notify me if flare up's become more consistent/regular.

## 2015-06-08 NOTE — Assessment & Plan Note (Addendum)
She feels slightly improved on Effexor 75 mg dose, good days and bad. Lack of motivation and feeling tired still. Failed SSRI treatment due to history of UC. Referral placed for therapy. Will consider adding Wellbutrin if no improvement/help with therapy. Will do TSH and CBC to evaluate fatigue to rule out metabolic causes. Follow up in 3 months.

## 2015-06-08 NOTE — Progress Notes (Signed)
Subjective:    Patient ID: Katelyn Dillon, female    DOB: 27-Oct-1940, 74 y.o.   MRN: [id number]  HPI  Katelyn Dillon is a 74 year old female who presents today for follow up of anxiety and depression. She is currently managed on Effexor XR 75 mg that was increased during her last visit in late October as she felt improved but not yet at goal.  Since her last visit she's feeling improved somedays. She continues to feel a lack of motivation. She's not followed with therapy recently. She has no daily schedule as she does not work. Her husband is out on the road during the week as a truck driver so she's alone often. She's met a few people in her apartment complex which has helped to keep her entertained. She mostly lacks motivation to do things she once enjoyed doing and feels tired most of the time. She could not tolerate SSRI's given history of ulcerative colitis.   2) Insomnia: History of for years. She was once trialed on sleeping pills in the past without improvement. She's tried OTC variations without improvement. Snores loudly per her partner, periods of apnea. She's never been evaluated for sleep apnea.  3) Shortness of Breath: History of asthma. Used nebulizer Monday this week during the rain. She's noticed shortness of breath and wheezing recently. She also has her albuterol inhaler which she's used several times.  She developed cold symptoms began 2 days ago with rhinorrhea, body aches, fatigue. No cough, fevers. She completed PFT's in December 2015 which showd moderate obstructive lung defect, no significant response to bronchodilator.    Review of Systems  Constitutional: Negative for fever and chills.  HENT: Positive for rhinorrhea. Negative for congestion and ear pain.   Respiratory: Positive for shortness of breath and wheezing. Negative for cough.   Musculoskeletal: Positive for myalgias.  Neurological: Negative for headaches.  Psychiatric/Behavioral: Positive for sleep disturbance.  Negative for suicidal ideas.       Lack of motivation       Past Medical History  Diagnosis Date  . Asthma   . Chicken pox   . Allergy   . Hypertension   . Ulcerative colitis [facility])     Social History   Social History  . Marital Status: Single    Spouse Name: N/A  . Number of Children: N/A  . Years of Education: N/A   Occupational History  . Not on file.   Social History Main Topics  . Smoking status: Never Smoker   . Smokeless tobacco: Not on file  . Alcohol Use: 0.0 oz/week    0 Standard drinks or equivalent per week     Comment: social  . Drug Use: Not on file  . Sexual Activity: Not on file   Other Topics Concern  . Not on file   Social History Narrative   Widowed. In a relationship.   Three children.   Recently moved from [place].   Enjoys working outdoors, sewing.    Past Surgical History  Procedure Laterality Date  . Abdominal hysterectomy  2000    Family History  Problem Relation Age of Onset  . Hypertension Mother   . Hypertension Father     No Known Allergies  Current Outpatient Prescriptions on File Prior to Visit  Medication Sig Dispense Refill  . amLODipine-benazepril (LOTREL) 10-20 MG per capsule Take 1 capsule by mouth daily. 30 capsule 5  . mesalamine (LIALDA) 1.2 G EC tablet Take 3 tablets (3.6 g total)  by mouth daily with breakfast. 90 tablet 5  . tiZANidine (ZANAFLEX) 4 MG tablet TAKE 1 TABLET EVERY 6 HOURS AS NEEDED FOR MUSCLE SPASMS 30 tablet 1  . venlafaxine XR (EFFEXOR XR) 75 MG 24 hr capsule Take 1 capsule (75 mg total) by mouth daily with breakfast. 30 capsule 3   No current facility-administered medications on file prior to visit.    BP 126/70 mmHg  Pulse 76  Temp(Src) 97.7 F (36.5 C) (Oral)  Ht 5' 3"  (1.6 m)  Wt 131 lb (59.421 kg)  BMI 23.21 kg/m2  SpO2 96%    Objective:   Physical Exam  Constitutional: She appears well-nourished.  HENT:  Right Ear: Tympanic membrane and ear canal normal.  Left Ear: Tympanic  membrane and ear canal normal.  Nose: Right sinus exhibits no maxillary sinus tenderness and no frontal sinus tenderness. Left sinus exhibits no maxillary sinus tenderness and no frontal sinus tenderness.  Mouth/Throat: Oropharynx is clear and moist.  Eyes: Conjunctivae are normal. Pupils are equal, round, and reactive to light.  Neck: Neck supple.  Cardiovascular: Normal rate and regular rhythm.   Pulmonary/Chest: Effort normal. She has no decreased breath sounds. She has wheezes in the right upper field and the left upper field. She has no rhonchi.  Lymphadenopathy:    She has no cervical adenopathy.  Skin: Skin is warm and dry.  Psychiatric: She has a normal mood and affect.          Assessment & Plan:

## 2015-06-08 NOTE — Assessment & Plan Note (Signed)
PFT's in December 2015 showd moderate obstructive lung defect, no significant response to bronchodilator.  Using albuterol neb and inhaler recently due to weather changes. Mild audible wheezing during exam, lungs sound tight during exam. Will do prednisone burst today.  Referral made to pulmonology for PFT's and sleep apnea evaluation.

## 2015-06-08 NOTE — Patient Instructions (Signed)
Complete lab work prior to leaving today. I will notify you of your results.  Start Prednisone short burst for wheezing and shortness of breath. Take 2 tablets by mouth daily for 3 days, then 1 tablet by mouth daily for 3 days.  Stop by the front and speak with Rosaria Ferries regarding your referral to Pulmonology and Therapy.  Follow up in 3 months for re-evaluation.   It was a pleasure to see you today!

## 2015-06-19 ENCOUNTER — Other Ambulatory Visit: Payer: Self-pay | Admitting: Primary Care

## 2015-06-19 MED ORDER — ALBUTEROL SULFATE HFA 108 (90 BASE) MCG/ACT IN AERS: 2.0000 | INHALATION_SPRAY | Freq: Four times a day (QID) | RESPIRATORY_TRACT | Status: DC | PRN

## 2015-06-19 NOTE — Telephone Encounter (Signed)
Received fax refill request for   [facility] 90MCG/INHALER  Rx has not been prescribed by Anda Kraft. Last seen on 06/08/2015. Follow up on 09/06/2015.

## 2015-06-26 ENCOUNTER — Other Ambulatory Visit: Payer: Self-pay | Admitting: Primary Care

## 2015-06-27 ENCOUNTER — Ambulatory Visit: Payer: Medicare Other | Admitting: Psychology

## 2015-06-28 ENCOUNTER — Other Ambulatory Visit: Payer: Self-pay | Admitting: Primary Care

## 2015-07-14 ENCOUNTER — Encounter: Payer: Self-pay | Admitting: Internal Medicine

## 2015-07-14 ENCOUNTER — Ambulatory Visit (INDEPENDENT_AMBULATORY_CARE_PROVIDER_SITE_OTHER): Payer: Medicare Other | Admitting: Internal Medicine

## 2015-07-14 VITALS — BP 134/72 | HR 97 | Ht 62.0 in | Wt 132.2 lb

## 2015-07-14 DIAGNOSIS — G4719 Other hypersomnia: Secondary | ICD-10-CM | POA: Diagnosis not present

## 2015-07-14 DIAGNOSIS — J4540 Moderate persistent asthma, uncomplicated: Secondary | ICD-10-CM | POA: Diagnosis not present

## 2015-07-14 MED ORDER — BUDESONIDE-FORMOTEROL FUMARATE 160-4.5 MCG/ACT IN AERO: 2.0000 | INHALATION_SPRAY | Freq: Two times a day (BID) | RESPIRATORY_TRACT | Status: DC

## 2015-07-14 NOTE — Patient Instructions (Addendum)
PFT before next visit.   Symbicort 160; 2 puffs twice daily, rinse mouth after use. Decrease nebulizer use to prn.   Sleep study.    Sleep Apnea Sleep apnea is disorder that affects a person's sleep. A person with sleep apnea has abnormal pauses in their breathing when they sleep. It is hard for them to get a good sleep. This makes a person tired during the day. It also can lead to other physical problems. There are three types of sleep apnea. One type is when breathing stops for a short time because your airway is blocked (obstructive sleep apnea). Another type is when the brain sometimes fails to give the normal signal to breathe to the muscles that control your breathing (central sleep apnea). The third type is a combination of the other two types. HOME CARE   Take all medicine as told by your doctor.  Avoid alcohol, calming medicines (sedatives), and depressant drugs.  Try to lose weight if you are overweight. Talk to your doctor about a healthy weight goal.  Your doctor may have you use a device that helps to open your airway. It can help you get the air that you need. It is called a positive airway pressure (PAP) device.   MAKE SURE YOU:   Understand these instructions.  Will watch your condition.  Will get help right away if you are not doing well or get worse.  It may take approximately 1 month for you to get used to wearing her CPAP every night.

## 2015-07-14 NOTE — Progress Notes (Addendum)
[place] [facility] Consultation      Assessment and Plan:  Asthma. -She has had a severe exacerbation requiring intubation in the past. Currently, her asthma symptoms are well controlled. -Start Symbicort 160-2 puffs twice daily, she is instructed to rinse mouth after use. I have asked her to decrease her nebulizer use to when necessary.  Excessive daytime sleepiness, snoring, witnessed apneas. -Suspicious for obstructive sleep apnea. -The patient will be sent for a sleep study.  Insomnia. -Sleep maintenance type, possibly related to anxiety.   Date: 07/14/2015  MRN# [id number] Katelyn Dillon September 12, 1940  Referring Physician: Dr. Alden Hipp Katelyn Dillon is a 75 y.o. old female seen in consultation for chief complaint of:    Chief Complaint  Patient presents with  . SLEEP CONSULT    pt. ref. by dr. Carlis Abbott for wheezing and loud snoring. pt. husband states she has loud snoring and looks like she stops breathing during sleep. daytime sleepiness. EPWORTH score: 8    HPI:   Her husband accompanies her and notes that she has trouble falling asleep and witnessed apneas at night. He notes that she is constantly tired and falls asleep easily during the day.  She was intubated in December of 2015 from asthma. They lived in [place] at that time, they moved here about 6 months ago. She used to see a pulmonary doctor in [place], she does not think she has ever been on a regular inhaler.  She  Is currently using nebulizer once or twice per day. She also carries a rescue inhaler twice per week.   She has a very irregular sleep schedule due to the fact that her husband is a truck driver and comes home at various different times at night and early in the morning. She is also has a history of insomnia with difficulty maintaining sleep and often waking up. She tells me for several years. She used to wake up and start doing housework. She only does this because she lives in an apartment and it would  be too loud, therefore, she simply stays awake.  She has zanaflex which she takes for neck pain about twice per week. She is not a smoker, nor does she live with one. Her father smoked. They have no pets at home, they got rid of several pets after her asthma attack.   She has a history of UC which is currently under control.   PMHX:   Past Medical History  Diagnosis Date  . Asthma   . Chicken pox   . Allergy   . Hypertension   . Ulcerative colitis [facility])    Surgical Hx:  Past Surgical History  Procedure Laterality Date  . Abdominal hysterectomy  2000   Family Hx:  Family History  Problem Relation Age of Onset  . Hypertension Mother   . Hypertension Father    Social Hx:   Social History  Substance Use Topics  . Smoking status: Never Smoker   . Smokeless tobacco: Not on file  . Alcohol Use: 0.0 oz/week    0 Standard drinks or equivalent per week     Comment: social   Medication:   Current Outpatient Rx  Name  Route  Sig  Dispense  Refill  . albuterol (PROVENTIL HFA;VENTOLIN HFA) 108 (90 BASE) MCG/ACT inhaler   Inhalation   Inhale 2 puffs into the lungs every 6 (six) hours as needed for wheezing or shortness of breath.   1 Inhaler   2   . amLODipine-benazepril (LOTREL)  10-20 MG per capsule   Oral   Take 1 capsule by mouth daily.   30 capsule   5   . mesalamine (LIALDA) 1.2 G EC tablet   Oral   Take 3 tablets (3.6 g total) by mouth daily with breakfast.   90 tablet   5   . predniSONE (DELTASONE) 20 MG tablet      Take 2 tablets for 3 days, then 1 tablet for 3 days.   9 tablet   0   . predniSONE (DELTASONE) 20 MG tablet      Take 3 tablets by mouth daily for 4 days, then 2 tablets by mouth daily for 4 days, then 1 tablet by mouth daily for 4 days.   24 tablet   0   . tiZANidine (ZANAFLEX) 4 MG tablet      TAKE 1 TABLET EVERY 6 HOURS AS NEEDED FOR MUSCLE SPASMS   30 tablet   1     No refills available   . tiZANidine (ZANAFLEX) 4 MG tablet       TAKE 1 TABLET EVERY 6 HOURS AS NEEDED FOR MUSCLE SPASMS   30 tablet   2     No refills available   . venlafaxine XR (EFFEXOR XR) 75 MG 24 hr capsule   Oral   Take 1 capsule (75 mg total) by mouth daily with breakfast.   30 capsule   3       Allergies:  Review of patient's allergies indicates no known allergies.  Review of Systems: Gen:  Denies  fever, sweats, chills HEENT: Denies blurred vision,  Cvc:  No dizziness, chest pain. Resp:   Denies cough Gi: Denies swallowing difficulty, stomach pain. Gu:  Denies bladder incontinence, burning urine Ext:   No Joint pain, stiffness. Skin: No skin rash,  hives Endoc:  No polyuria, polydipsia. Psych: No depression, insomnia. Other:  All other systems were reviewed with the patient and were negative other that what is mentioned in the HPI.   Physical Examination:   VS: BP 134/72 mmHg  Pulse 97  Ht 5' 2"  (1.575 m)  Wt 132 lb 3.2 oz (59.966 kg)  BMI 24.17 kg/m2  SpO2 93%  General Appearance: No distress  Neuro:without focal findings,  speech normal,  HEENT: PERRLA, EOM intact.   Pulmonary: normal breath sounds, No wheezing. Mallampati 3 CardiovascularNormal S1,S2.  No m/r/g.   Abdomen: Benign, Soft, non-tender. Renal:  No costovertebral tenderness  GU:  No performed at this time. Endoc: No evident thyromegaly, no signs of acromegaly. Skin:   warm, no rashes, no ecchymosis  Extremities: normal, no cyanosis, clubbing.  Other findings:    LABORATORY PANEL:   CBC No results for input(s): WBC, HGB, HCT, PLT in the last 168 hours. ------------------------------------------------------------------------------------------------------------------  Chemistries  No results for input(s): NA, K, CL, CO2, GLUCOSE, BUN, CREATININE, CALCIUM, MG, AST, ALT, ALKPHOS, BILITOT in the last 168 hours.  Invalid input(s):  GFRCGP ------------------------------------------------------------------------------------------------------------------  Cardiac Enzymes No results for input(s): TROPONINI in the last 168 hours. ------------------------------------------------------------  RADIOLOGY:  No results found.     Thank  you for the consultation and for allowing [facility] to assist in the care of your patient. Our recommendations are noted above.  Please contact us if we can be of further service.   Marda Stalker, MD.  Board Certified in Internal Medicine, Pulmonary Medicine, [place], and Sleep Medicine.  [facility] Office Number: [phone]  Patricia Pesa, M.D.  Vilinda Boehringer, M.D.  Merton Border, M.D

## 2015-07-14 NOTE — Addendum Note (Signed)
Addended by: Maryanna Shape A on: 07/14/2015 11:36 AM   Modules accepted: Orders

## 2015-07-26 ENCOUNTER — Other Ambulatory Visit: Payer: Self-pay | Admitting: Primary Care

## 2015-07-26 NOTE — Telephone Encounter (Signed)
Electronically refill request for   mesalamine (LIALDA) 1.2 G EC tablet   Take 3 tablets (3.6 g total) by mouth daily with breakfast.  Dispense: 90 tablet   Refills: 5     Last prescribed on 03/01/2015        venlafaxine XR (EFFEXOR XR) 75 MG 24 hr capsule   Take 1 capsule (75 mg total) by mouth daily with breakfast.  Dispense: 30 capsule   Refills: 3     Last prescribed on 04/27/2015   Last seen on 06/08/2015. Follow up on 09/06/2015.

## 2015-07-27 ENCOUNTER — Ambulatory Visit: Payer: Medicare Other | Attending: Pulmonary Disease

## 2015-07-27 DIAGNOSIS — G4761 Periodic limb movement disorder: Secondary | ICD-10-CM | POA: Insufficient documentation

## 2015-07-27 DIAGNOSIS — G479 Sleep disorder, unspecified: Secondary | ICD-10-CM | POA: Diagnosis not present

## 2015-08-04 DIAGNOSIS — G4761 Periodic limb movement disorder: Secondary | ICD-10-CM | POA: Diagnosis not present

## 2015-08-07 ENCOUNTER — Encounter: Payer: Self-pay | Admitting: Primary Care

## 2015-08-07 ENCOUNTER — Other Ambulatory Visit: Payer: Self-pay | Admitting: Primary Care

## 2015-08-07 ENCOUNTER — Ambulatory Visit (INDEPENDENT_AMBULATORY_CARE_PROVIDER_SITE_OTHER): Payer: Medicare Other | Admitting: Primary Care

## 2015-08-07 ENCOUNTER — Ambulatory Visit: Payer: Medicare Other | Admitting: Primary Care

## 2015-08-07 VITALS — BP 144/84 | HR 76 | Temp 97.7°F | Ht 62.0 in | Wt 128.1 lb

## 2015-08-07 DIAGNOSIS — R5383 Other fatigue: Secondary | ICD-10-CM | POA: Insufficient documentation

## 2015-08-07 DIAGNOSIS — E559 Vitamin D deficiency, unspecified: Secondary | ICD-10-CM

## 2015-08-07 DIAGNOSIS — K5190 Ulcerative colitis, unspecified, without complications: Secondary | ICD-10-CM

## 2015-08-07 DIAGNOSIS — E876 Hypokalemia: Secondary | ICD-10-CM

## 2015-08-07 LAB — BASIC METABOLIC PANEL
BUN: 25 mg/dL — AB (ref 6–23)
CHLORIDE: 102 meq/L (ref 96–112)
CO2: 27 mEq/L (ref 19–32)
Calcium: 10 mg/dL (ref 8.4–10.5)
Creatinine, Ser: 0.71 mg/dL (ref 0.40–1.20)
GFR: 85.79 mL/min (ref >60.00)
Glucose, Bld: 111 mg/dL — ABNORMAL HIGH (ref 70–99)
POTASSIUM: 3.1 meq/L — AB (ref 3.5–5.1)
Sodium: 141 mEq/L (ref 135–145)

## 2015-08-07 LAB — CBC
HCT: 40.5 % (ref 36.0–46.0)
Hemoglobin: 13.1 g/dL (ref 12.0–15.0)
MCHC: 32.3 g/dL (ref 30.0–36.0)
MCV: 89 fl (ref 78.0–100.0)
PLATELETS: 434 10*3/uL — AB (ref 150.0–400.0)
RBC: 4.55 Mil/uL (ref 3.87–5.11)
RDW: 16.1 % — AB (ref 11.5–15.5)
WBC: 9.6 10*3/uL (ref 4.0–10.5)

## 2015-08-07 LAB — VITAMIN D 25 HYDROXY (VIT D DEFICIENCY, FRACTURES): VITD: 18.18 ng/mL — ABNORMAL LOW (ref 30.00–100.00)

## 2015-08-07 MED ORDER — VITAMIN D (ERGOCALCIFEROL) 1.25 MG (50000 UNIT) PO CAPS: ORAL_CAPSULE | ORAL | Status: DC

## 2015-08-07 MED ORDER — PREDNISONE 20 MG PO TABS: ORAL_TABLET | ORAL | Status: DC

## 2015-08-07 MED ORDER — POTASSIUM CHLORIDE CRYS ER 10 MEQ PO TBCR: 10.0000 meq | EXTENDED_RELEASE_TABLET | Freq: Two times a day (BID) | ORAL | Status: DC

## 2015-08-07 NOTE — Progress Notes (Signed)
Pre visit review using our clinic review tool, if applicable. No additional management support is needed unless otherwise documented below in the visit note. 

## 2015-08-07 NOTE — Patient Instructions (Addendum)
Your ECG does not show any changes. Your heart appears to be functioning well.  Complete lab work prior to leaving today. I will notify you of your results once received.   Please notify me if your fatigue returns and does not dissipate within a few days.   Work to [employer] and start exercising. This should help to increase energy levels.  It was a pleasure to see you today!

## 2015-08-07 NOTE — Progress Notes (Signed)
Subjective:    Patient ID: Katelyn Dillon, female    DOB: 1940-08-26, 75 y.o.   MRN: [id number]  HPI  [name] is a 75 year old female who presents today with a chief complaint of fatigue. She has a history of anxiety and depression and has been evaluated for fatigue in previous visits. She is currently managed on Effexor ER 75 mg and is going to therapy. Since her last visit she's made friends with neighbors and is not feeling depressed.   She and her husband have been sick off and on since slightly before Christmas. Her symptoms included low grade fever, chills, fatigue. She stopped feeling "ill" towards the beginning of last week, but continued to feel fatigued. She's been through a sleep study recently and is waiting for the report. Her fatigue became worse last Wednesday. She's stared taking her prednisone, that is used PRN for Ulcerative Colitis flares, Thursday last week and has felt improved since. She's been taking Vitamin B 12 daily as well. She denies fatigue, chest pain, shortness of breath, lower extremity edema.  Review of Systems  Constitutional: Positive for fatigue. Negative for fever and chills.  HENT: Negative for congestion and sinus pressure.   Respiratory: Negative for cough and shortness of breath.   Gastrointestinal: Negative for abdominal pain.  Neurological: Negative for weakness.       Past Medical History  Diagnosis Date  . Asthma   . Chicken pox   . Allergy   . Hypertension   . Ulcerative colitis [facility])     Social History   Social History  . Marital Status: Single    Spouse Name: N/A  . Number of Children: N/A  . Years of Education: N/A   Occupational History  . Not on file.   Social History Main Topics  . Smoking status: Never Smoker   . Smokeless tobacco: Not on file  . Alcohol Use: 0.0 oz/week    0 Standard drinks or equivalent per week     Comment: social  . Drug Use: Not on file  . Sexual Activity: Not on file   Other Topics Concern    . Not on file   Social History Narrative   Widowed. In a relationship.   Three children.   Recently moved from [place].   Enjoys working outdoors, sewing.    Past Surgical History  Procedure Laterality Date  . Abdominal hysterectomy  2000    Family History  Problem Relation Age of Onset  . Hypertension Mother   . Hypertension Father     No Known Allergies  Current Outpatient Prescriptions on File Prior to Visit  Medication Sig Dispense Refill  . albuterol (PROVENTIL HFA;VENTOLIN HFA) 108 (90 BASE) MCG/ACT inhaler Inhale 2 puffs into the lungs every 6 (six) hours as needed for wheezing or shortness of breath. 1 Inhaler 2  . amLODipine-benazepril (LOTREL) 10-20 MG per capsule Take 1 capsule by mouth daily. 30 capsule 5  . budesonide-formoterol (SYMBICORT) 160-4.5 MCG/ACT inhaler Inhale 2 puffs into the lungs 2 (two) times daily. 1 Inhaler 5  . LIALDA 1.2 g EC tablet TAKE 3 TABLETS (3.6 G TOTAL) BY MOUTH DAILY WITH BREAKFAST. 90 tablet 4  . tiZANidine (ZANAFLEX) 4 MG tablet TAKE 1 TABLET EVERY 6 HOURS AS NEEDED FOR MUSCLE SPASMS 30 tablet 2  . venlafaxine XR (EFFEXOR-XR) 75 MG 24 hr capsule TAKE 1 CAPSULE (75 MG TOTAL) BY MOUTH DAILY WITH BREAKFAST. 90 capsule 1   No current facility-administered medications on file  prior to visit.    BP 144/84 mmHg  Pulse 76  Temp(Src) 97.7 F (36.5 C) (Oral)  Ht 5' 2"  (1.575 m)  Wt 128 lb 1.9 oz (58.115 kg)  BMI 23.43 kg/m2  SpO2 92%    Objective:   Physical Exam  Constitutional: She is oriented to person, place, and time. She appears well-nourished.  HENT:  Right Ear: Tympanic membrane and ear canal normal.  Left Ear: Tympanic membrane and ear canal normal.  Nose: Nose normal. Right sinus exhibits no maxillary sinus tenderness and no frontal sinus tenderness. Left sinus exhibits no maxillary sinus tenderness and no frontal sinus tenderness.  Mouth/Throat: Oropharynx is clear and moist.  Eyes: EOM are normal. Pupils are equal,  round, and reactive to light.  Neck: Neck supple.  Cardiovascular: Normal rate and regular rhythm.   Pulmonary/Chest: Effort normal and breath sounds normal. She has no wheezes.  Neurological: She is alert and oriented to person, place, and time. No cranial nerve deficit.  Skin: Skin is warm and dry.  Psychiatric: She has a normal mood and affect.          Assessment & Plan:

## 2015-08-07 NOTE — Assessment & Plan Note (Signed)
Intermittent in previous visits, diagnosed and treated for anxiety and depression. Feels well managed. Fatigue worse over last several weeks, improved with prednisone that she took (home RX for UC flares). No fatigue today. ECG completed, no changes from prior. Will check CBC, BMP, Vitamin D levels. TSH normal in past. Unsure of etiology for fatigue. She will keep me updated.

## 2015-08-08 ENCOUNTER — Telehealth: Payer: Self-pay | Admitting: *Deleted

## 2015-08-08 DIAGNOSIS — G4761 Periodic limb movement disorder: Secondary | ICD-10-CM

## 2015-08-08 DIAGNOSIS — R5383 Other fatigue: Secondary | ICD-10-CM

## 2015-08-08 DIAGNOSIS — G4700 Insomnia, unspecified: Secondary | ICD-10-CM

## 2015-08-08 DIAGNOSIS — I10 Essential (primary) hypertension: Secondary | ICD-10-CM

## 2015-08-08 DIAGNOSIS — G2581 Restless legs syndrome: Secondary | ICD-10-CM

## 2015-08-08 DIAGNOSIS — D509 Iron deficiency anemia, unspecified: Secondary | ICD-10-CM

## 2015-08-08 NOTE — Telephone Encounter (Signed)
Pt informed of sleep study results. Ferritin level ordered. Pt states she will have labs drawn and will f/u at scheduled appt in April due to her being out of town in March. Nothing further needed.

## 2015-08-14 ENCOUNTER — Other Ambulatory Visit: Payer: Medicare Other

## 2015-08-16 ENCOUNTER — Other Ambulatory Visit: Payer: Self-pay | Admitting: Primary Care

## 2015-08-17 NOTE — Telephone Encounter (Signed)
Electronically refill request for   albuterol (PROVENTIL HFA;VENTOLIN HFA) 108 (90 BASE) MCG/ACT inhaler   Inhale 2 puffs into the lungs every 6 (six) hours as needed for wheezing or shortness of breath.  Dispense: 1 Inhaler   Refills: 2     Last prescribed on  06/19/2015. Last seen on 08/07/2015. Follow up on 09/06/15

## 2015-08-19 ENCOUNTER — Other Ambulatory Visit: Payer: Self-pay | Admitting: Primary Care

## 2015-08-19 DIAGNOSIS — I10 Essential (primary) hypertension: Secondary | ICD-10-CM

## 2015-08-21 NOTE — Telephone Encounter (Signed)
Electronically refill request for   amLODipine-benazepril (LOTREL) 10-20 MG per capsule   Take 1 capsule by mouth daily.  Dispense: 30 capsule   Refills: 5     Last prescribed on 03/15/2015. Last seen on 08/07/2015. Follow up on 09/06/2015.

## 2015-09-06 ENCOUNTER — Ambulatory Visit: Payer: Medicare Other | Admitting: Primary Care

## 2015-10-02 ENCOUNTER — Other Ambulatory Visit: Payer: Self-pay | Admitting: Primary Care

## 2015-10-02 DIAGNOSIS — K5190 Ulcerative colitis, unspecified, without complications: Secondary | ICD-10-CM

## 2015-10-02 NOTE — Telephone Encounter (Signed)
Electronically refill request for   predniSONE (DELTASONE) 20 MG tablet   Take 2 tablets for 3 days, then 1 tablet for 3 days.  Dispense: 9 tablet   Refills: 0     Last prescribed and seen on 08/07/2015. No future appt.

## 2015-10-12 ENCOUNTER — Other Ambulatory Visit: Payer: Self-pay | Admitting: Primary Care

## 2015-10-12 DIAGNOSIS — K5190 Ulcerative colitis, unspecified, without complications: Secondary | ICD-10-CM

## 2015-10-12 NOTE — Telephone Encounter (Signed)
Electronically refill request for   predniSONE (DELTASONE) 20 MG tablet   TAKE TWO TABLETS DAILY FOR 3 DAYS THEN TAKE 1 TABLET DAILY FOR 3 DAYS  Dispense: 9 tablet   Refills: 0     Last prescribed on 10/02/2015. Last seen on 08/07/2015. Next appt is lab on 10/23/2015.

## 2015-10-17 ENCOUNTER — Other Ambulatory Visit: Payer: Self-pay | Admitting: Primary Care

## 2015-10-17 DIAGNOSIS — K5190 Ulcerative colitis, unspecified, without complications: Secondary | ICD-10-CM

## 2015-10-17 MED ORDER — PREDNISONE 20 MG PO TABS: ORAL_TABLET | ORAL | Status: DC

## 2015-10-17 NOTE — Telephone Encounter (Signed)
Called and spoken to patient. Patient stated that it usually it takes a 7 to 8 days treatment of prednisone when she has a flare up. That's why she had requested for refill.

## 2015-10-20 ENCOUNTER — Ambulatory Visit: Payer: Medicare Other | Admitting: Internal Medicine

## 2015-10-20 ENCOUNTER — Encounter: Payer: Self-pay | Admitting: *Deleted

## 2015-10-23 ENCOUNTER — Other Ambulatory Visit: Payer: Self-pay | Admitting: Primary Care

## 2015-10-23 ENCOUNTER — Other Ambulatory Visit (INDEPENDENT_AMBULATORY_CARE_PROVIDER_SITE_OTHER): Payer: Medicare Other

## 2015-10-23 DIAGNOSIS — E559 Vitamin D deficiency, unspecified: Secondary | ICD-10-CM | POA: Diagnosis not present

## 2015-10-23 DIAGNOSIS — E876 Hypokalemia: Secondary | ICD-10-CM | POA: Diagnosis not present

## 2015-10-23 LAB — BASIC METABOLIC PANEL
BUN: 19 mg/dL (ref 6–23)
CALCIUM: 9.6 mg/dL (ref 8.4–10.5)
CHLORIDE: 103 meq/L (ref 96–112)
CO2: 29 meq/L (ref 19–32)
CREATININE: 0.74 mg/dL (ref 0.40–1.20)
GFR: 81.74 mL/min (ref >60.00)
Glucose, Bld: 84 mg/dL (ref 70–99)
Potassium: 3.7 mEq/L (ref 3.5–5.1)
SODIUM: 141 meq/L (ref 135–145)

## 2015-10-23 LAB — VITAMIN D 25 HYDROXY (VIT D DEFICIENCY, FRACTURES): VITD: 24.03 ng/mL — AB (ref 30.00–100.00)

## 2015-10-23 MED ORDER — VITAMIN D (ERGOCALCIFEROL) 1.25 MG (50000 UNIT) PO CAPS: ORAL_CAPSULE | ORAL | Status: DC

## 2015-10-25 ENCOUNTER — Other Ambulatory Visit: Payer: Self-pay | Admitting: Primary Care

## 2015-10-25 ENCOUNTER — Telehealth: Payer: Self-pay | Admitting: Primary Care

## 2015-10-25 DIAGNOSIS — K5190 Ulcerative colitis, unspecified, without complications: Secondary | ICD-10-CM

## 2015-10-25 MED ORDER — PREDNISONE 20 MG PO TABS: ORAL_TABLET | ORAL | Status: DC

## 2015-10-25 NOTE — Telephone Encounter (Signed)
Called and spoken to patient. She stated that she does not get flares that often. If she gets enough strong and long dosage of prednisone, it will a while until another flare up.

## 2015-10-25 NOTE — Telephone Encounter (Signed)
Electronically refill request for   predniSONE (DELTASONE) 20 MG tablet   TAKE TWO TABLETS DAILY FOR 3 DAYS THEN TAKE 1 TABLET DAILY FOR 3 DAYS  Dispense: 9 tablet   Refills: 0     Last prescribed on 10/02/2015. Last seen on 08/07/2015. No future appt.

## 2015-10-25 NOTE — Telephone Encounter (Signed)
Please notify Katelyn Dillon that I've sent in a refill of her Prednisone for Ulcerative Coliitis with the long duration of dosing.  How often is she experiencing flares?  It's not good for her to be taking so much prednisone so frequently as it can cause complications long term. If she's getting recurrent flares, we need to think about a more permeant solution/treatment.  Please notify me of her response.

## 2015-11-09 DIAGNOSIS — Z23 Encounter for immunization: Secondary | ICD-10-CM | POA: Diagnosis not present

## 2015-11-09 DIAGNOSIS — W540XXA Bitten by dog, initial encounter: Secondary | ICD-10-CM | POA: Diagnosis not present

## 2015-11-09 DIAGNOSIS — L089 Local infection of the skin and subcutaneous tissue, unspecified: Secondary | ICD-10-CM | POA: Diagnosis not present

## 2015-11-09 DIAGNOSIS — S61452A Open bite of left hand, initial encounter: Secondary | ICD-10-CM | POA: Diagnosis not present

## 2015-11-09 DIAGNOSIS — S51851A Open bite of right forearm, initial encounter: Secondary | ICD-10-CM | POA: Diagnosis not present

## 2015-11-15 ENCOUNTER — Other Ambulatory Visit: Payer: Self-pay | Admitting: Primary Care

## 2015-11-15 DIAGNOSIS — M62838 Other muscle spasm: Secondary | ICD-10-CM

## 2015-11-15 NOTE — Telephone Encounter (Signed)
Electronically refill request for   tiZANidine (ZANAFLEX) 4 MG tablet   TAKE 1 TABLET EVERY 6 HOURS AS NEEDED FOR MUSCLE SPASMS  Dispense: 30 tablet   Refills: 0     Last prescribed on 01/25/2015. Last seen on 08/07/2015. No future appt.

## 2015-11-23 ENCOUNTER — Other Ambulatory Visit: Payer: Self-pay | Admitting: Primary Care

## 2015-11-23 ENCOUNTER — Encounter: Payer: Self-pay | Admitting: Primary Care

## 2015-11-23 ENCOUNTER — Ambulatory Visit (INDEPENDENT_AMBULATORY_CARE_PROVIDER_SITE_OTHER): Payer: Medicare Other | Admitting: Primary Care

## 2015-11-23 ENCOUNTER — Ambulatory Visit (INDEPENDENT_AMBULATORY_CARE_PROVIDER_SITE_OTHER)
Admission: RE | Admit: 2015-11-23 | Discharge: 2015-11-23 | Disposition: A | Discharge status: Home / Self Care | Payer: Medicare Other | Source: Ambulatory Visit | Attending: Primary Care | Admitting: Primary Care

## 2015-11-23 VITALS — BP 140/86 | HR 112 | Temp 97.9°F | Ht 62.0 in | Wt 128.8 lb

## 2015-11-23 DIAGNOSIS — K51919 Ulcerative colitis, unspecified with unspecified complications: Secondary | ICD-10-CM | POA: Diagnosis not present

## 2015-11-23 DIAGNOSIS — M542 Cervicalgia: Secondary | ICD-10-CM

## 2015-11-23 DIAGNOSIS — G8929 Other chronic pain: Secondary | ICD-10-CM

## 2015-11-23 DIAGNOSIS — R5383 Other fatigue: Secondary | ICD-10-CM | POA: Diagnosis not present

## 2015-11-23 DIAGNOSIS — M50321 Other cervical disc degeneration at C4-C5 level: Secondary | ICD-10-CM | POA: Diagnosis not present

## 2015-11-23 DIAGNOSIS — F418 Other specified anxiety disorders: Secondary | ICD-10-CM | POA: Diagnosis not present

## 2015-11-23 DIAGNOSIS — M50323 Other cervical disc degeneration at C6-C7 level: Secondary | ICD-10-CM | POA: Diagnosis not present

## 2015-11-23 DIAGNOSIS — E559 Vitamin D deficiency, unspecified: Secondary | ICD-10-CM | POA: Diagnosis not present

## 2015-11-23 DIAGNOSIS — F419 Anxiety disorder, unspecified: Principal | ICD-10-CM

## 2015-11-23 DIAGNOSIS — M62838 Other muscle spasm: Secondary | ICD-10-CM

## 2015-11-23 DIAGNOSIS — M6248 Contracture of muscle, other site: Secondary | ICD-10-CM

## 2015-11-23 DIAGNOSIS — F329 Major depressive disorder, single episode, unspecified: Secondary | ICD-10-CM

## 2015-11-23 LAB — CBC
HCT: 42.9 % (ref 36.0–46.0)
Hemoglobin: 13.9 g/dL (ref 12.0–15.0)
MCHC: 32.4 g/dL (ref 30.0–36.0)
MCV: 87.6 fl (ref 78.0–100.0)
Platelets: 447 10*3/uL — ABNORMAL HIGH (ref 150.0–400.0)
RBC: 4.89 Mil/uL (ref 3.87–5.11)
RDW: 14.2 % (ref 11.5–15.5)
WBC: 8.1 10*3/uL (ref 4.0–10.5)

## 2015-11-23 LAB — VITAMIN D 25 HYDROXY (VIT D DEFICIENCY, FRACTURES): VITD: 30.44 ng/mL (ref 30.00–100.00)

## 2015-11-23 LAB — BASIC METABOLIC PANEL
BUN: 18 mg/dL (ref 6–23)
CHLORIDE: 104 meq/L (ref 96–112)
CO2: 28 mEq/L (ref 19–32)
Calcium: 10.2 mg/dL (ref 8.4–10.5)
Creatinine, Ser: 0.86 mg/dL (ref 0.40–1.20)
GFR: 68.71 mL/min (ref >60.00)
Glucose, Bld: 97 mg/dL (ref 70–99)
POTASSIUM: 3.9 meq/L (ref 3.5–5.1)
SODIUM: 140 meq/L (ref 135–145)

## 2015-11-23 MED ORDER — BUPROPION HCL ER (XL) 150 MG PO TB24: 150.0000 mg | ORAL_TABLET | Freq: Every day | ORAL | Status: DC

## 2015-11-23 NOTE — Patient Instructions (Signed)
Start Wellbutrin Xl 150 mg tablets for depression. Take 1 tablet by mouth every morning. Continue the Effexor as prescribed.  You will be contacted regarding your referral to GI for evaluation of your ulcerative colitis.  Please let us know if you have not heard back within one week.   Complete xray(s) prior to leaving today. I will notify you of your results once received.  Complete lab work prior to leaving today. I will notify you of your results once received.   Follow up in 6 weeks for re-evaluation of anxiety, depression, and fatigue.  It was a pleasure to see you today!

## 2015-11-23 NOTE — Assessment & Plan Note (Signed)
More frequent flares, especially since recent antibiotic use. She agrees to see GI for management of ulcerative colitis as she's requiring use of prednisone more frequently. Referral to GI placed.

## 2015-11-23 NOTE — Assessment & Plan Note (Signed)
Continues to experience. Will check basic labs and vitamin D levels today. Suspect related to anxiety and depression. TSH normal in Winter of 2016.

## 2015-11-23 NOTE — Progress Notes (Signed)
Subjective:    Patient ID: Katelyn Dillon, female    DOB: May 16, 1941, 75 y.o.   MRN: [id number]  HPI  Katelyn Dillon is a 75 year old female who presents today with a chief complaint of abdominal pain. She has a history of Ulcerative Colitis and will take prednisone for acute flares. She's had no recent GI evaluation as she has declined this in the past. She moved to [place] from [place] nearly one year ago. She underwent colonoscopy in 2013 with evidence of colitis, otherwise normal. She is currently managed on Lialda 1.2 g.    She was treated on 05/11 for a dog bite that occurred the day prior. She was treated with a prescription for Augmentin 10 day course. Since she was treated for the dog bite she's had increased generalized abdominal pain with diarrhea that has now caused her inablity to eat much. She is experiencing UC flares once monthly on average. Denies fevers.  2) Anxiety and Depression: Ongoing since she moved to [place] from [place]. She's had increased stress with her husbands job, family stress, and also with the recent dog attack. She is currently managed on Effexor XR 75 mg. She never went to therapy as recommended, she doesn't believe it will help. She has declined and doesn't feel like doing anything while at home. Denies SI/HI.  3) Neck Pain: Present for years. She's undergone physical therapy twice. She was diagnosed with cervical arthritis years ago. Her pain is mostly located to the posterior neck around C5 with radiation of pain to her lower neck. Denies numbness/tingling. She's been tizanidine 4 mg (1/2 tablet) once daily mostly, only during acute flares. She has experience increased pain for the past 1-2 weeks as she's recently been through a dog bite and family stress.   Review of Systems  Constitutional: Positive for fatigue. Negative for fever and unexpected weight change.  Respiratory: Negative for shortness of breath and wheezing.   Gastrointestinal: Positive for nausea, abdominal pain and  diarrhea. Negative for vomiting.  Musculoskeletal: Positive for neck pain.  Psychiatric/Behavioral: Negative for suicidal ideas. The patient is nervous/anxious.        See HPI       Past Medical History  Diagnosis Date  . Asthma   . Chicken pox   . Allergy   . Hypertension   . Ulcerative colitis [facility])      Social History   Social History  . Marital Status: Single    Spouse Name: N/A  . Number of Children: N/A  . Years of Education: N/A   Occupational History  . Not on file.   Social History Main Topics  . Smoking status: Never Smoker   . Smokeless tobacco: Not on file  . Alcohol Use: 0.0 oz/week    0 Standard drinks or equivalent per week     Comment: social  . Drug Use: Not on file  . Sexual Activity: Not on file   Other Topics Concern  . Not on file   Social History Narrative   Widowed. In a relationship.   Three children.   Recently moved from [place].   Enjoys working outdoors, sewing.    Past Surgical History  Procedure Laterality Date  . Abdominal hysterectomy  2000    Family History  Problem Relation Age of Onset  . Hypertension Mother   . Hypertension Father     No Known Allergies  Current Outpatient Prescriptions on File Prior to Visit  Medication Sig Dispense Refill  . amLODipine-benazepril (  LOTREL) 10-20 MG capsule TAKE 1 CAPSULE BY MOUTH DAILY. 30 capsule 5  . budesonide-formoterol (SYMBICORT) 160-4.5 MCG/ACT inhaler Inhale 2 puffs into the lungs 2 (two) times daily. 1 Inhaler 5  . LIALDA 1.2 g EC tablet TAKE 3 TABLETS (3.6 G TOTAL) BY MOUTH DAILY WITH BREAKFAST. 90 tablet 4  . potassium chloride (K-DUR,KLOR-CON) 10 MEQ tablet Take 1 tablet (10 mEq total) by mouth 2 (two) times daily. 10 tablet 0  . PROAIR HFA 108 (90 Base) MCG/ACT inhaler INHALE 2 PUFFS INTO THE LUNGS EVERY 6 (SIX) HOURS AS NEEDED FOR WHEEZING OR SHORTNESS OF BREATH. 8.5 g 5  . tiZANidine (ZANAFLEX) 4 MG tablet TAKE 1 TABLET EVERY 6 HOURS AS NEEDED FOR MUSCLE SPASMS 30  tablet 3  . venlafaxine XR (EFFEXOR-XR) 75 MG 24 hr capsule TAKE 1 CAPSULE (75 MG TOTAL) BY MOUTH DAILY WITH BREAKFAST. 90 capsule 1  . Vitamin D, Ergocalciferol, (DRISDOL) 50000 units CAPS capsule Take 1 capsule by mouth once weekly for a total of 12 weeks. 12 capsule 0  . predniSONE (DELTASONE) 20 MG tablet Take 3 tablets for 4 days, then 2 tablets for 4 days, then 1 tablet for 4 days. (Patient not taking: Reported on 11/23/2015) 24 tablet 0   No current facility-administered medications on file prior to visit.    BP 140/86 mmHg  Pulse 112  Temp(Src) 97.9 F (36.6 C) (Oral)  Ht 5' 2"  (1.575 m)  Wt 128 lb 12.8 oz (58.423 kg)  BMI 23.55 kg/m2  SpO2 95%    Objective:   Physical Exam  Constitutional: She appears well-nourished.  Neck: Neck supple. No spinous process tenderness and no muscular tenderness present. Decreased range of motion present.  Overall decent ROM, but does have stiffness.  Cardiovascular: Normal rate and regular rhythm.   Pulmonary/Chest: Effort normal and breath sounds normal. She has no wheezes.  Skin: Skin is warm and dry.          Assessment & Plan:

## 2015-11-23 NOTE — Assessment & Plan Note (Signed)
Long history of neck pain. No recent imaging. Will obtain cervical spine images today. Discussed to use muscle relaxer's sparingly. Declines physical therapy. Recommended massage therapy, she will look into this.

## 2015-11-23 NOTE — Assessment & Plan Note (Signed)
Not well managed. Increased stress with family issues and husbands occupation, also with increase in depression since recent dog bite. Add in Wellbutrin XL 150 mg, continue Effexor. Declines therapy, denies SI/HI. Follow up in 6 weeks.

## 2015-11-23 NOTE — Progress Notes (Signed)
Pre visit review using our clinic review tool, if applicable. No additional management support is needed unless otherwise documented below in the visit note. 

## 2015-11-24 ENCOUNTER — Other Ambulatory Visit: Payer: Self-pay | Admitting: Primary Care

## 2015-11-24 DIAGNOSIS — F32A Depression, unspecified: Secondary | ICD-10-CM

## 2015-11-24 DIAGNOSIS — F329 Major depressive disorder, single episode, unspecified: Secondary | ICD-10-CM

## 2015-11-24 DIAGNOSIS — F419 Anxiety disorder, unspecified: Principal | ICD-10-CM

## 2015-11-28 ENCOUNTER — Telehealth: Payer: Self-pay | Admitting: Primary Care

## 2015-11-28 ENCOUNTER — Other Ambulatory Visit: Payer: Self-pay | Admitting: Primary Care

## 2015-11-28 NOTE — Telephone Encounter (Signed)
Electronically refill request for   predniSONE (DELTASONE) 20 MG tablet    Last prescribed on 10/25/2015.  Last seen on 11/23/2015. Follow up on 01/04/2016.

## 2015-11-28 NOTE — Telephone Encounter (Signed)
Completed and placed on [place] desk. 

## 2015-11-28 NOTE — Telephone Encounter (Signed)
Electronically refill request for   venlafaxine XR (EFFEXOR-XR) 75 MG 24 hr capsule   TAKE 1 CAPSULE (75 MG TOTAL) BY MOUTH DAILY WITH BREAKFAST.  Dispense: 90 capsule   Refills: 1     Last prescribed 07/26/2015. Last seen on 11/23/2015. Follow up on 01/04/2016.

## 2015-11-28 NOTE — Telephone Encounter (Signed)
Spouse brought in FMLA paperwork  He wanted continuous fmla from 11/25/15 to 11/1015 to care for his wife  In [place] For review and signature

## 2015-11-29 NOTE — Telephone Encounter (Signed)
Left message for gary to call office

## 2015-11-29 NOTE — Telephone Encounter (Signed)
Katelyn Dillon aware paperwork has been faxed Copy for file Copy for scan Copy for pt

## 2015-11-30 ENCOUNTER — Telehealth: Payer: Self-pay

## 2015-11-30 NOTE — Telephone Encounter (Signed)
Correct, GI referral.

## 2015-11-30 NOTE — Telephone Encounter (Signed)
I spoke with Santiago Glad and advised the referral on 11/23/15 was a GI referral. Santiago Glad voiced understanding and will send note to [place] as FYI.

## 2015-11-30 NOTE — Telephone Encounter (Signed)
Santiago Glad (DPR signed) left v/m wanting to confirm if pts referral on 11/23/15 visit is for gastroenterology or oncologist; pt told Santiago Glad that she is supposed to see oncologist. Per 11/23/15 office note GI referral done. No answer and no v/m at [facility] phone.

## 2015-12-06 NOTE — Telephone Encounter (Signed)
Spouse called stating ms Petre is not doing any better and he needs a note for his fmla stating he needs to be out with her for another 2 weeks through 12/23/15 she has a gi appointment Thursday next week Please advise when ready for pick up

## 2015-12-06 NOTE — Telephone Encounter (Signed)
Completed and placed in chan's inbox.

## 2015-12-07 NOTE — Telephone Encounter (Signed)
Notified that letter is ready for pick up.

## 2015-12-07 NOTE — Telephone Encounter (Signed)
Patient's husband,Gary,called.  Dominica Severin is asking for letter to be faxed to [facility] [phone].  Dominica Severin said the claim number may be on the initial paperwork. Dominica Severin can be reached at [phone].

## 2015-12-11 DIAGNOSIS — M4692 Unspecified inflammatory spondylopathy, cervical region: Secondary | ICD-10-CM | POA: Diagnosis not present

## 2015-12-12 NOTE — Telephone Encounter (Signed)
Faxed the letter to [place] at [phone].  Did not see claim number on the paperwork however did find the Associate WIN: [id number].

## 2015-12-14 DIAGNOSIS — K51919 Ulcerative colitis, unspecified with unspecified complications: Secondary | ICD-10-CM | POA: Diagnosis not present

## 2015-12-18 ENCOUNTER — Telehealth: Payer: Self-pay | Admitting: Primary Care

## 2015-12-18 NOTE — Telephone Encounter (Signed)
Noted. No follow up necessary, please have him give me an end date and I'll type up a letter.

## 2015-12-18 NOTE — Telephone Encounter (Signed)
Noted. Letter is ready for pickup and placed in Chan's in box.

## 2015-12-18 NOTE — Telephone Encounter (Signed)
Called and spoken to patient's spouse. Was given the date of 12/23/2015 to 01/06/2016.

## 2015-12-18 NOTE — Telephone Encounter (Signed)
Husband called - wants to exten his fmla for additional 2 weeks, would like to know if pt needs follow up visit or can extend with out visit? cb number is [phone]

## 2015-12-19 NOTE — Telephone Encounter (Signed)
Spoken to patient's spouse and notified that letter is ready for pick up. I was informed to faxed the letter just like the one before.  Faxed the letter to [facility] [phone] and [phone]  Case # [id number] TC

## 2015-12-27 ENCOUNTER — Ambulatory Visit (INDEPENDENT_AMBULATORY_CARE_PROVIDER_SITE_OTHER): Payer: Medicare Other | Admitting: Primary Care

## 2015-12-27 ENCOUNTER — Encounter: Payer: Self-pay | Admitting: Primary Care

## 2015-12-27 VITALS — BP 134/80 | HR 79 | Temp 98.3°F | Ht 62.0 in | Wt 131.1 lb

## 2015-12-27 DIAGNOSIS — G8929 Other chronic pain: Secondary | ICD-10-CM

## 2015-12-27 DIAGNOSIS — F419 Anxiety disorder, unspecified: Secondary | ICD-10-CM

## 2015-12-27 DIAGNOSIS — R5383 Other fatigue: Secondary | ICD-10-CM | POA: Diagnosis not present

## 2015-12-27 DIAGNOSIS — K5190 Ulcerative colitis, unspecified, without complications: Secondary | ICD-10-CM

## 2015-12-27 DIAGNOSIS — F329 Major depressive disorder, single episode, unspecified: Secondary | ICD-10-CM

## 2015-12-27 DIAGNOSIS — M542 Cervicalgia: Secondary | ICD-10-CM

## 2015-12-27 DIAGNOSIS — G4700 Insomnia, unspecified: Secondary | ICD-10-CM | POA: Diagnosis not present

## 2015-12-27 DIAGNOSIS — F418 Other specified anxiety disorders: Secondary | ICD-10-CM

## 2015-12-27 NOTE — Assessment & Plan Note (Signed)
Overall improved since last visit. Continue Effexor and Wellbutrin for now. Suspect her husband's leave from work has led to an increase in her mood. We'll continue to monitor. Follow-up in 3 months.

## 2015-12-27 NOTE — Assessment & Plan Note (Signed)
Currently following with GI, colonoscopy to be completed next month. Follow-up with GI scheduled in July.

## 2015-12-27 NOTE — Progress Notes (Signed)
Pre visit review using our clinic review tool, if applicable. No additional management support is needed unless otherwise documented below in the visit note. 

## 2015-12-27 NOTE — Assessment & Plan Note (Addendum)
Improved since treatment with Wellbutrin for depression.

## 2015-12-27 NOTE — Progress Notes (Signed)
Subjective:    Patient ID: Katelyn Dillon, female    DOB: [birth year], 75 y.o.   MRN: [id number]  HPI  Katelyn Dillon is a 75 year old female who presents today for follow up.   1) Fatigue: Ongoing for months. Vitamin D, TSH, CBC unremarkable in the past. Suspected last visit fatigue due to depression that was uncontrolled. Since her last visit she continues to experience fatigue. She has the motivation to do things but gets tired when she starts the product. She does have mild shortness of breath but also a history of moderate obstructive asthma. Denies lower extremity edema, cough, chest pain, decreased concentration. She does have history of cardiac arrest years ago. She's had no recent echocardiogram.  2) Anxiety and Depression: Evaluated in late May for continued to anxiety and stress over family issues and the lack of her husbands availability to be home due to work. Last visit we added in Wellbutrin Xl 150 mg as she declined therapy with a psychologist.   Since her last visit she's feeling improved as she is eating well, sleeping well, and has been socializing with her neighbor. Denies SI/HI. Her husband is currently on leave from work which has significantly increased her mood and increased or depression. He is here today requesting an extension of the sleeve through 02/03/2016.  3) Ulcerative Colitis: Long history of, also experiencing more frequent flares requiring recurrent refills of prednisone. She was referred to GI last visit and was evaluated in mid June. She will be undergoing repeat colonoscopy and treatment per GI. No recent flares.  4) Neck Pain: Recurrent muscle spasms of neck that have been ongoing for years. She completed cervical xray last visit with disc disease present to C4-5 and C6-7. She was referred to an orthopedist for further evaluation.   Since her last visit she's been evaluated by an orthopedist who doesn't believe it to be arthritis. She was provided with  exercises and told to continue her Tizanidine as her discomfort was muscular related. She takes one half tablet of tizanidine as needed for discomfort. She will go weeks without requiring her tizanidine.  Review of Systems  Constitutional: Positive for fatigue. Negative for fever.  Respiratory: Negative for cough and shortness of breath.   Cardiovascular: Negative for leg swelling.  Gastrointestinal: Negative for abdominal pain.  Musculoskeletal: Positive for neck pain.  Neurological: Negative for dizziness.  Psychiatric/Behavioral: Negative for suicidal ideas and sleep disturbance. The patient is not nervous/anxious.        Much improved       Past Medical History  Diagnosis Date  . Asthma   . Chicken pox   . Allergy   . Hypertension   . Ulcerative colitis [facility])      Social History   Social History  . Marital Status: Single    Spouse Name: N/A  . Number of Children: N/A  . Years of Education: N/A   Occupational History  . Not on file.   Social History Main Topics  . Smoking status: Never Smoker   . Smokeless tobacco: Not on file  . Alcohol Use: 0.0 oz/week    0 Standard drinks or equivalent per week     Comment: social  . Drug Use: Not on file  . Sexual Activity: Not on file   Other Topics Concern  . Not on file   Social History Narrative   Widowed. In a relationship.   Three children.   Recently moved from [place].   Enjoys  working outdoors, sewing.    Past Surgical History  Procedure Laterality Date  . Abdominal hysterectomy  2000    Family History  Problem Relation Age of Onset  . Hypertension Mother   . Hypertension Father     No Known Allergies  Current Outpatient Prescriptions on File Prior to Visit  Medication Sig Dispense Refill  . amLODipine-benazepril (LOTREL) 10-20 MG capsule TAKE 1 CAPSULE BY MOUTH DAILY. 30 capsule 5  . budesonide-formoterol (SYMBICORT) 160-4.5 MCG/ACT inhaler Inhale 2 puffs into the lungs 2 (two) times daily. 1  Inhaler 5  . buPROPion (WELLBUTRIN XL) 150 MG 24 hr tablet Take 1 tablet (150 mg total) by mouth daily. 30 tablet 3  . LIALDA 1.2 g EC tablet TAKE 3 TABLETS (3.6 G TOTAL) BY MOUTH DAILY WITH BREAKFAST. 90 tablet 4  . potassium chloride (K-DUR,KLOR-CON) 10 MEQ tablet Take 1 tablet (10 mEq total) by mouth 2 (two) times daily. 10 tablet 0  . PROAIR HFA 108 (90 Base) MCG/ACT inhaler INHALE 2 PUFFS INTO THE LUNGS EVERY 6 (SIX) HOURS AS NEEDED FOR WHEEZING OR SHORTNESS OF BREATH. 8.5 g 5  . tiZANidine (ZANAFLEX) 4 MG tablet TAKE 1 TABLET EVERY 6 HOURS AS NEEDED FOR MUSCLE SPASMS 30 tablet 3  . venlafaxine XR (EFFEXOR-XR) 75 MG 24 hr capsule TAKE 1 CAPSULE (75 MG TOTAL) BY MOUTH DAILY WITH BREAKFAST. 90 capsule 1  . Vitamin D, Ergocalciferol, (DRISDOL) 50000 units CAPS capsule Take 1 capsule by mouth once weekly for a total of 12 weeks. 12 capsule 0   No current facility-administered medications on file prior to visit.    BP 134/80 mmHg  Pulse 79  Temp(Src) 98.3 F (36.8 C) (Oral)  Ht 5' 2"  (1.575 m)  Wt 131 lb 1.9 oz (59.476 kg)  BMI 23.98 kg/m2  SpO2 98%    Objective:   Physical Exam  Constitutional: She appears well-nourished.  Neck: Neck supple.  Cardiovascular: Normal rate and regular rhythm.   No murmur heard. Pulmonary/Chest: Effort normal and breath sounds normal.  Skin: Skin is warm and dry.  Psychiatric: She has a normal mood and affect.  Much improved when compared to last visit.          Assessment & Plan:

## 2015-12-27 NOTE — Assessment & Plan Note (Signed)
Continues to experience fatigue. Full workup including labs and treatment for depression completed. Will consider echocardiogram given her history of cardiac arrest although she has no shortness of breath or lower extremity edema. EKG last visit was unremarkable.

## 2015-12-27 NOTE — Assessment & Plan Note (Signed)
Evaluated by orthopedist to believes her pain to be muscular, not arthritic. Continue tizanidine as needed for now.

## 2015-12-27 NOTE — Patient Instructions (Signed)
Continue Wellbutrin Xl 150 and Effexor XL 75 mg.  Continue Vitamin D capsules once weekly until your currently bottle is empty, then start taking Vitamin D 1000 units daily.  I will be in touch soon regarding a plan for your fatigue.  Follow up in 3 months for re-evaluation.  It was a pleasure to see you today!

## 2016-01-04 ENCOUNTER — Ambulatory Visit: Payer: Medicare Other | Admitting: Primary Care

## 2016-01-18 ENCOUNTER — Telehealth: Payer: Self-pay | Admitting: Primary Care

## 2016-01-18 ENCOUNTER — Other Ambulatory Visit: Payer: Self-pay | Admitting: Primary Care

## 2016-01-18 DIAGNOSIS — K51919 Ulcerative colitis, unspecified with unspecified complications: Secondary | ICD-10-CM

## 2016-01-18 MED ORDER — PREDNISONE 20 MG PO TABS: ORAL_TABLET | ORAL | Status: DC

## 2016-01-18 NOTE — Telephone Encounter (Signed)
Please notify patient that I received a refill request for her prednisone. Since she is now following with GI she will now need to notify them of her flares and asked them for advice on treatment.

## 2016-01-18 NOTE — Telephone Encounter (Signed)
Spoken and notified patient of [name]'s comments. Patient verbalized understanding.  Patient stated the reason for this request is because patient is going to [place]. She just wants to have just in case of a flare up. She is going discuss it further with GI on her appointment.

## 2016-01-18 NOTE — Telephone Encounter (Signed)
Noted. Will send one round of prednisone to pharmacy as she will be traveling.

## 2016-02-12 ENCOUNTER — Encounter: Payer: Self-pay | Admitting: *Deleted

## 2016-02-13 ENCOUNTER — Encounter
Admission: RE | Disposition: A | Discharge status: Home / Self Care | Payer: Self-pay | Source: Ambulatory Visit | Attending: Gastroenterology

## 2016-02-13 ENCOUNTER — Ambulatory Visit: Payer: Medicare Other | Admitting: Anesthesiology

## 2016-02-13 ENCOUNTER — Ambulatory Visit
Admission: RE | Admit: 2016-02-13 | Discharge: 2016-02-13 | Disposition: A | Discharge status: Home / Self Care | Payer: Medicare Other | Source: Ambulatory Visit | Attending: Gastroenterology | Admitting: Gastroenterology

## 2016-02-13 ENCOUNTER — Encounter: Payer: Self-pay | Admitting: *Deleted

## 2016-02-13 DIAGNOSIS — J45909 Unspecified asthma, uncomplicated: Secondary | ICD-10-CM | POA: Diagnosis not present

## 2016-02-13 DIAGNOSIS — Z79899 Other long term (current) drug therapy: Secondary | ICD-10-CM | POA: Insufficient documentation

## 2016-02-13 DIAGNOSIS — F419 Anxiety disorder, unspecified: Secondary | ICD-10-CM | POA: Diagnosis not present

## 2016-02-13 DIAGNOSIS — F329 Major depressive disorder, single episode, unspecified: Secondary | ICD-10-CM | POA: Diagnosis not present

## 2016-02-13 DIAGNOSIS — Z538 Procedure and treatment not carried out for other reasons: Secondary | ICD-10-CM | POA: Diagnosis not present

## 2016-02-13 DIAGNOSIS — Z96659 Presence of unspecified artificial knee joint: Secondary | ICD-10-CM | POA: Diagnosis not present

## 2016-02-13 DIAGNOSIS — K5190 Ulcerative colitis, unspecified, without complications: Secondary | ICD-10-CM | POA: Insufficient documentation

## 2016-02-13 DIAGNOSIS — Z8719 Personal history of other diseases of the digestive system: Secondary | ICD-10-CM | POA: Diagnosis not present

## 2016-02-13 DIAGNOSIS — I10 Essential (primary) hypertension: Secondary | ICD-10-CM | POA: Diagnosis not present

## 2016-02-13 HISTORY — DX: Anxiety disorder, unspecified: F41.9

## 2016-02-13 HISTORY — DX: Depression, unspecified: F32.A

## 2016-02-13 HISTORY — PX: COLONOSCOPY WITH PROPOFOL: SHX5780

## 2016-02-13 HISTORY — DX: Major depressive disorder, single episode, unspecified: F32.9

## 2016-02-13 SURGERY — COLONOSCOPY WITH PROPOFOL
Anesthesia: General

## 2016-02-13 MED ORDER — MIDAZOLAM HCL 5 MG/5ML IJ SOLN
INTRAMUSCULAR | Status: DC | PRN
  Administered 2016-02-13: 1 mg via INTRAVENOUS

## 2016-02-13 MED ORDER — PROPOFOL 10 MG/ML IV BOLUS
INTRAVENOUS | Status: DC | PRN
  Administered 2016-02-13: 20 mg via INTRAVENOUS
  Administered 2016-02-13: 30 mg via INTRAVENOUS

## 2016-02-13 MED ORDER — SODIUM CHLORIDE 0.9 % IV SOLN: INTRAVENOUS | Status: DC

## 2016-02-13 MED ORDER — LIDOCAINE HCL (PF) 2 % IJ SOLN
INTRAMUSCULAR | Status: DC | PRN
  Administered 2016-02-13: 50 mg

## 2016-02-13 MED ORDER — PROPOFOL 500 MG/50ML IV EMUL
INTRAVENOUS | Status: DC | PRN
  Administered 2016-02-13: 75 ug/kg/min via INTRAVENOUS

## 2016-02-13 MED ORDER — FENTANYL CITRATE (PF) 100 MCG/2ML IJ SOLN
INTRAMUSCULAR | Status: DC | PRN
  Administered 2016-02-13: 50 ug via INTRAVENOUS

## 2016-02-13 MED ORDER — SODIUM CHLORIDE 0.9 % IV SOLN
INTRAVENOUS | Status: DC
  Administered 2016-02-13: 10:00:00 via INTRAVENOUS

## 2016-02-13 NOTE — Anesthesia Postprocedure Evaluation (Signed)
Anesthesia Post Note  Patient: Katelyn Dillon  Procedure(s) Performed: Procedure(s) (LRB): COLONOSCOPY WITH PROPOFOL (N/A)  Patient location during evaluation: Endoscopy Anesthesia Type: General Level of consciousness: awake and alert Pain management: pain level controlled Vital Signs Assessment: post-procedure vital signs reviewed and stable Respiratory status: spontaneous breathing, nonlabored ventilation, respiratory function stable and patient connected to nasal cannula oxygen Cardiovascular status: blood pressure returned to baseline and stable Postop Assessment: no signs of nausea or vomiting Anesthetic complications: no    Last Vitals:  Vitals:   02/13/16 1211 02/13/16 1221  BP: (!) 159/87 (!) 160/84  Pulse: 81 78  Resp: 15 10  Temp:      Last Pain:  Vitals:   02/13/16 0942  TempSrc: Tympanic                 Precious Haws Piscitello

## 2016-02-13 NOTE — H&P (Signed)
Outpatient short stay form Pre-procedure 02/13/2016 11:27 AM Katelyn Sails MD  Primary Physician: [name] NP  Reason for visit:  Colonoscopy  History of present illness:  Patient is a 75 year old female with personal history of ulcerative colitis. Her last colonoscopy was at least 3 years ago. She has a diagnosis of ulcerative colitis initially about 40 years ago. He had been on Remicade however had not been able to continue that agent. She is currently taking Lialda and we'll have breakthrough flares with it. She tolerated her prep well for colonoscopy. She takes no aspirin or blood thinning agents.    Current Facility-Administered Medications:  .  0.9 %  sodium chloride infusion, , Intravenous, Continuous, Katelyn Sails, MD, Last Rate: 20 mL/hr at 02/13/16 0957 .  0.9 %  sodium chloride infusion, , Intravenous, Continuous, Katelyn Sails, MD  Prescriptions Prior to Admission  Medication Sig Dispense Refill Last Dose  . amLODipine-benazepril (LOTREL) 10-20 MG capsule TAKE 1 CAPSULE BY MOUTH DAILY. 30 capsule 5 02/12/2016 at Unknown time  . budesonide-formoterol (SYMBICORT) 160-4.5 MCG/ACT inhaler Inhale 2 puffs into the lungs 2 (two) times daily. 1 Inhaler 5 02/13/2016 at Unknown time  . buPROPion (WELLBUTRIN XL) 150 MG 24 hr tablet Take 1 tablet (150 mg total) by mouth daily. 30 tablet 3 02/12/2016 at Unknown time  . LIALDA 1.2 g EC tablet TAKE 3 TABLETS (3.6 G TOTAL) BY MOUTH DAILY WITH BREAKFAST. 90 tablet 4 02/12/2016 at Unknown time  . potassium chloride (K-DUR,KLOR-CON) 10 MEQ tablet Take 1 tablet (10 mEq total) by mouth 2 (two) times daily. 10 tablet 0 02/12/2016 at Unknown time  . predniSONE (DELTASONE) 20 MG tablet Take 3 tablets for 4 days, then 2 tablets for 4 days, then 1 tablet for 4 days. 24 tablet 0 Past Week at Unknown time  . venlafaxine XR (EFFEXOR-XR) 75 MG 24 hr capsule TAKE 1 CAPSULE (75 MG TOTAL) BY MOUTH DAILY WITH BREAKFAST. 90 capsule 1 02/12/2016 at Unknown time   . PROAIR HFA 108 (90 Base) MCG/ACT inhaler INHALE 2 PUFFS INTO THE LUNGS EVERY 6 (SIX) HOURS AS NEEDED FOR WHEEZING OR SHORTNESS OF BREATH. 8.5 g 5 Taking  . tiZANidine (ZANAFLEX) 4 MG tablet TAKE 1 TABLET EVERY 6 HOURS AS NEEDED FOR MUSCLE SPASMS 30 tablet 3 Taking  . Vitamin D, Ergocalciferol, (DRISDOL) 50000 units CAPS capsule Take 1 capsule by mouth once weekly for a total of 12 weeks. 12 capsule 0 02/11/2016     No Known Allergies   Past Medical History:  Diagnosis Date  . Allergy   . Anxiety   . Asthma   . Chicken pox   . Depression   . Hypertension   . Ulcerative colitis ([place])     Review of systems:      Physical Exam    Heart and lungs: Regular rate and rhythm without rub or gallop, lungs are bilaterally clear.    HEENT: Normocephalic atraumatic eyes are anicteric    Other:     Pertinant exam for procedure: Soft nontender nondistended bowel sounds positive normoactive.    Planned proceedures: Colonoscopy and indicated procedures. I have discussed the risks benefits and complications of procedures to include not limited to bleeding, infection, perforation and the risk of sedation and the patient wishes to proceed.    Katelyn Sails, MD Gastroenterology 02/13/2016  11:27 AM

## 2016-02-13 NOTE — Op Note (Signed)
[facility] Patient Name: Katelyn Dillon Procedure Date: 02/13/2016 11:33 AM MRN: [id number] Account #: [id number] Date of Birth: [birth year] Admit Type: Outpatient Age: 75 Room: [facility] ENDO ROOM 3 Gender: Female Note Status: Finalized Procedure:            Colonoscopy Indications:          Personal history of ulcerative colitis Providers:            Lollie Sails, MD Referring MD:         Pleas Koch (Referring MD) Medicines:            Monitored Anesthesia Care Complications:        No immediate complications. Procedure:            Pre-Anesthesia Assessment:                       - ASA Grade Assessment: III - A patient with severe                        systemic disease.                       After obtaining informed consent, the colonoscope was                        passed under direct vision. Throughout the procedure,                        the patient's blood pressure, pulse, and oxygen                        saturations were monitored continuously. The                        Colonoscope was introduced through the anus with the                        intention of advancing to the ileum. The scope was                        advanced to the transverse colon before the procedure                        was aborted. Medications were given. The colonoscopy                        was unusually difficult due to poor bowel prep. The                        quality of the bowel preparation was poor. Findings:      appearance of colon consistant with long term ulcerative with a "garden       hose" appearance. Scope was introduced and taken to the distal       transverse. Prep became increasingly worse with more proximal location,       and in the transverse it was not possible to rinse adequately to remove       the stool coating the surfaces. Impression:           - Preparation of the colon was poor.                       -  No specimens  collected.                       - The procedure was aborted due to poor bowel prep. Recommendation:       - Discharge patient to home.                       - repeat prep and reschedule. Procedure Code(s):    --- Professional ---                       [phone], 35, Colonoscopy, flexible; diagnostic, including                        collection of specimen(s) by brushing or washing, when                        performed (separate procedure) Diagnosis Code(s):    --- Professional ---                       Z53.8, Procedure and treatment not carried out for                        other reasons                       Z87.19, Personal history of other diseases of the                        digestive system CPT copyright 2016 American Medical Association. All rights reserved. The codes documented in this report are preliminary and upon coder review may  be revised to meet current compliance requirements. Lollie Sails, MD 02/13/2016 11:52:48 AM This report has been signed electronically. Number of Addenda: 0 Note Initiated On: 02/13/2016 11:33 AM Total Procedure Duration: 0 hours 6 minutes 22 seconds       [facility]

## 2016-02-13 NOTE — Anesthesia Preprocedure Evaluation (Signed)
Anesthesia Evaluation  Patient identified by MRN, date of birth, ID band Patient awake    Reviewed: Allergy & Precautions, H&P , NPO status , Patient's Chart, lab work & pertinent test results  History of Anesthesia Complications Negative for: history of anesthetic complications  Airway Mallampati: II  TM Distance: <3 FB Neck ROM: limited    Dental  (+) Poor Dentition   Pulmonary neg shortness of breath, asthma ,    Pulmonary exam normal breath sounds clear to auscultation       Cardiovascular Exercise Tolerance: Good hypertension, (-) angina(-) Past MI and (-) DOE Normal cardiovascular exam Rhythm:regular Rate:Normal     Neuro/Psych PSYCHIATRIC DISORDERS Anxiety Depression negative neurological ROS     GI/Hepatic Neg liver ROS, PUD,   Endo/Other  negative endocrine ROS  Renal/GU negative Renal ROS  negative genitourinary   Musculoskeletal   Abdominal   Peds  Hematology negative hematology ROS (+)   Anesthesia Other Findings Past Medical History: No date: Allergy No date: Anxiety No date: Asthma No date: Chicken pox No date: Depression No date: Hypertension No date: Ulcerative colitis [facility])  Past Surgical History: 2000: ABDOMINAL HYSTERECTOMY No date: JOINT REPLACEMENT No date: REPLACEMENT UNICONDYLAR JOINT KNEE  BMI    Body Mass Index:  23.41 kg/m      Reproductive/Obstetrics negative OB ROS                             Anesthesia Physical Anesthesia Plan  ASA: III  Anesthesia Plan: General   Post-op Pain Management:    Induction:   Airway Management Planned:   Additional Equipment:   Intra-op Plan:   Post-operative Plan:   Informed Consent: I have reviewed the patients History and Physical, chart, labs and discussed the procedure including the risks, benefits and alternatives for the proposed anesthesia with the patient or authorized representative who has  indicated his/her understanding and acceptance.   Dental Advisory Given  Plan Discussed with: Anesthesiologist, CRNA and Surgeon  Anesthesia Plan Comments:         Anesthesia Quick Evaluation

## 2016-02-13 NOTE — Transfer of Care (Signed)
Immediate Anesthesia Transfer of Care Note  Patient: Katelyn Dillon  Procedure(s) Performed: Procedure(s): COLONOSCOPY WITH PROPOFOL (N/A)  Patient Location: PACU  Anesthesia Type:General  Level of Consciousness: sedated  Airway & Oxygen Therapy: Patient Spontanous Breathing and Patient connected to nasal cannula oxygen  Post-op Assessment: Report given to RN and Post -op Vital signs reviewed and stable  Post vital signs: Reviewed and stable  Last Vitals:  Vitals:   02/13/16 0942  BP: (!) 146/88  Pulse: (!) 101  Resp: 16  Temp: 36.3 C    Last Pain:  Vitals:   02/13/16 0942  TempSrc: Tympanic         Complications: No apparent anesthesia complications

## 2016-02-14 ENCOUNTER — Encounter: Payer: Self-pay | Admitting: Gastroenterology

## 2016-03-13 ENCOUNTER — Other Ambulatory Visit: Payer: Self-pay | Admitting: Primary Care

## 2016-03-13 DIAGNOSIS — I10 Essential (primary) hypertension: Secondary | ICD-10-CM

## 2016-03-15 DIAGNOSIS — K5190 Ulcerative colitis, unspecified, without complications: Secondary | ICD-10-CM | POA: Diagnosis not present

## 2016-03-18 ENCOUNTER — Other Ambulatory Visit: Payer: Self-pay | Admitting: Primary Care

## 2016-03-18 DIAGNOSIS — F329 Major depressive disorder, single episode, unspecified: Secondary | ICD-10-CM

## 2016-03-18 DIAGNOSIS — F419 Anxiety disorder, unspecified: Principal | ICD-10-CM

## 2016-03-24 ENCOUNTER — Other Ambulatory Visit: Payer: Self-pay | Admitting: Primary Care

## 2016-03-25 ENCOUNTER — Other Ambulatory Visit: Payer: Self-pay | Admitting: Primary Care

## 2016-03-25 DIAGNOSIS — M62838 Other muscle spasm: Secondary | ICD-10-CM

## 2016-03-25 NOTE — Telephone Encounter (Signed)
Last filled 02-20-16 #30 Last OV 12-27-15 No Future OV

## 2016-03-25 NOTE — Telephone Encounter (Signed)
Ok to refill? Electronically refill request for   LIALDA 1.2 g EC tablet  Last prescribed on 07/26/2015. Last seen on 6/28/22017. No future appt.

## 2016-03-28 DIAGNOSIS — M8588 Other specified disorders of bone density and structure, other site: Secondary | ICD-10-CM | POA: Diagnosis not present

## 2016-03-28 DIAGNOSIS — Z7952 Long term (current) use of systemic steroids: Secondary | ICD-10-CM | POA: Diagnosis not present

## 2016-04-03 ENCOUNTER — Ambulatory Visit (INDEPENDENT_AMBULATORY_CARE_PROVIDER_SITE_OTHER): Payer: Medicare Other | Admitting: Primary Care

## 2016-04-03 VITALS — BP 116/72 | HR 72 | Temp 97.9°F | Ht 62.0 in | Wt 129.8 lb

## 2016-04-03 DIAGNOSIS — F419 Anxiety disorder, unspecified: Secondary | ICD-10-CM

## 2016-04-03 DIAGNOSIS — F418 Other specified anxiety disorders: Secondary | ICD-10-CM

## 2016-04-03 DIAGNOSIS — M542 Cervicalgia: Secondary | ICD-10-CM

## 2016-04-03 DIAGNOSIS — K51919 Ulcerative colitis, unspecified with unspecified complications: Secondary | ICD-10-CM | POA: Diagnosis not present

## 2016-04-03 DIAGNOSIS — J4540 Moderate persistent asthma, uncomplicated: Secondary | ICD-10-CM

## 2016-04-03 DIAGNOSIS — R5383 Other fatigue: Secondary | ICD-10-CM

## 2016-04-03 DIAGNOSIS — G8929 Other chronic pain: Secondary | ICD-10-CM

## 2016-04-03 DIAGNOSIS — F329 Major depressive disorder, single episode, unspecified: Secondary | ICD-10-CM

## 2016-04-03 DIAGNOSIS — I10 Essential (primary) hypertension: Secondary | ICD-10-CM | POA: Diagnosis not present

## 2016-04-03 MED ORDER — PREDNISONE 20 MG PO TABS: ORAL_TABLET | ORAL | 0 refills | Status: DC

## 2016-04-03 MED ORDER — BUDESONIDE 180 MCG/ACT IN AEPB: 2.0000 | INHALATION_SPRAY | Freq: Two times a day (BID) | RESPIRATORY_TRACT | 5 refills | Status: DC

## 2016-04-03 NOTE — Assessment & Plan Note (Signed)
Stable today, has not had Wellbutrin in several weeks. Continue Effexor for now.

## 2016-04-03 NOTE — Progress Notes (Signed)
Pre visit review using our clinic review tool, if applicable. No additional management support is needed unless otherwise documented below in the visit note. 

## 2016-04-03 NOTE — Progress Notes (Signed)
Subjective:    Patient ID: Katelyn Dillon, female    DOB: 31-Oct-1940, 75 y.o.   MRN: [id number]  HPI  Katelyn Dillon is a 75 year old female who presents today for follow up.  1) Anxiety and Depression: She is currently managed on Effexor XR 75 mg. She was also taking Wellbutrin that was added several months ago, but is no longer taking. She started having nightmares/weird dreams and stopped taking this three weeks ago. Overall she feels well managed on Effexor. She will be leaving for a trip to [place] and [place] with a good friend and is very excited.  2) Ulcerative Colltis: Currently following with GI. Managed on Lialda. Recent attempted colonoscopy which was stopped due to incomplete bowel prep. She denies recent flares of ulcerative colitis but is requesting a perception for prednisone to take with her on her trip in case she experiences a flare. She will be traveling in late October and will be gone for 2 weeks. She is due to repeat her colonoscopy later this year.  3) Essential Hypertension: Currently managed on Lotrel 10/20 mg. Her BP in the office today is stable at 116/72. Denies chest pain, dizziness, changes in vision.  4) Asthma: Long history of chronic asthma. 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 She is currently managed on ipratropium that she has been using twice daily for years. She experiences shortness of breath, wheezing when walking outdoors, resting in her home, making her bed, climbing a ladder. She has never been on any controller medications.  Review of Systems  Respiratory: Positive for shortness of breath and wheezing. Negative for cough.   Gastrointestinal: Negative for abdominal pain.  Neurological: Negative for dizziness and headaches.  Psychiatric/Behavioral: Negative for suicidal ideas.       Occasional anxiety, overall feels well managed on Effexor.       Past Medical History:  Diagnosis Date  .  Allergy   . Anxiety   . Asthma   . Chicken pox   . Depression   . Hypertension   . Ulcerative colitis [facility])      Social History   Social History  . Marital status: Single    Spouse name: N/A  . Number of children: N/A  . Years of education: N/A   Occupational History  . Not on file.   Social History Main Topics  . Smoking status: Never Smoker  . Smokeless tobacco: Never Used  . Alcohol use 0.0 oz/week     Comment: social  . Drug use: No  . Sexual activity: Not on file   Other Topics Concern  . Not on file   Social History Narrative   Widowed. In a relationship.   Three children.   Recently moved from [place].   Enjoys working outdoors, sewing.    Past Surgical History:  Procedure Laterality Date  . ABDOMINAL HYSTERECTOMY  2000  . COLONOSCOPY WITH PROPOFOL N/A 02/13/2016   Procedure: COLONOSCOPY WITH PROPOFOL;  Surgeon: Lollie Sails, MD;  Location: [facility] ENDOSCOPY;  Service: Endoscopy;  Laterality: N/A;  . JOINT REPLACEMENT    . REPLACEMENT UNICONDYLAR JOINT KNEE      Family History  Problem Relation Age of Onset  . Hypertension Mother   . Hypertension Father     No Known Allergies  Current Outpatient Prescriptions on File Prior to Visit  Medication Sig Dispense Refill  . amLODipine-benazepril (LOTREL) 10-20 MG capsule TAKE 1 CAPSULE BY MOUTH DAILY. 30 capsule 5  .  LIALDA 1.2 g EC tablet TAKE 3 TABLETS (3.6 G TOTAL) BY MOUTH DAILY WITH BREAKFAST. 90 tablet 4  . PROAIR HFA 108 (90 Base) MCG/ACT inhaler INHALE 2 PUFFS INTO THE LUNGS EVERY 6 (SIX) HOURS AS NEEDED FOR WHEEZING OR SHORTNESS OF BREATH. 8.5 g 5  . tiZANidine (ZANAFLEX) 4 MG tablet TAKE 1 TABLET EVERY 6 HOURS AS NEEDED FOR MUSCLE SPASMS 90 tablet 1  . venlafaxine XR (EFFEXOR-XR) 75 MG 24 hr capsule TAKE 1 CAPSULE (75 MG TOTAL) BY MOUTH DAILY WITH BREAKFAST. 90 capsule 1  . potassium chloride (K-DUR,KLOR-CON) 10 MEQ tablet Take 1 tablet (10 mEq total) by mouth 2 (two) times daily. (Patient not  taking: Reported on 04/03/2016) 10 tablet 0   No current facility-administered medications on file prior to visit.     BP 116/72   Pulse 72   Temp 97.9 F (36.6 C) (Oral)   Ht 5' 2"  (1.575 m)   Wt 129 lb 12.8 oz (58.9 kg)   SpO2 98%   BMI 23.74 kg/m    Objective:   Physical Exam  Constitutional: She appears well-nourished.  Cardiovascular: Normal rate and regular rhythm.   Pulmonary/Chest: Effort normal and breath sounds normal. She has no wheezes.  Skin: Skin is warm and dry.  Psychiatric: She has a normal mood and affect.          Assessment & Plan:

## 2016-04-03 NOTE — Assessment & Plan Note (Signed)
Following with GI, no recent flares. Prescription for prednisone provided as she will be traveling to [place] towards the end of the month.

## 2016-04-03 NOTE — Assessment & Plan Note (Signed)
Could be due to uncontrolled asthma symptoms. Prescription for Pulmicort sent to pharmacy for treatment.

## 2016-04-03 NOTE — Assessment & Plan Note (Signed)
Recently discovered that she is been using ipratropium twice daily for years. Persistent complaints of fatigue with shortness of breath and wheezing upon normal activities at home. Asthma not well controlled based off of her symptoms. Prescription for Pulmicort sent to pharmacy for symptoms. She will update [place] in several weeks regarding results.

## 2016-04-03 NOTE — Assessment & Plan Note (Signed)
Evaluated by ortho. Continues to take tizanidine. Continue current regimen.

## 2016-04-03 NOTE — Patient Instructions (Addendum)
Start Pulmicort inhaler for asthma. This is to be used everyday. Inhale 2 puffs into the lungs twice daily. Use the albuterol nebulized treatments only as needed. If you are using these more than 2-3 times weekly, then your asthma is uncontrolled.   You are due for your annual wellness visit. Please schedule this before the end of the ear at your convenience.   It was a pleasure to see you today!

## 2016-04-03 NOTE — Assessment & Plan Note (Signed)
Stable on Lotrel, continue.

## 2016-04-04 ENCOUNTER — Telehealth: Payer: Self-pay | Admitting: Primary Care

## 2016-04-04 NOTE — Telephone Encounter (Signed)
[place] Call Center Patient Name: DEZAREE TRACEY DOB: 1940-07-03 Initial Comment Caller states mom taking pain meds for neck pain, daughter is concerned she is getting addicted to it, she is taking quite a bit she says Nurse Assessment Nurse: Martyn Ehrich, RN, Felicia Date/Time (Eastern Time): 04/04/2016 3:06:21 PM Confirm and document reason for call. If symptomatic, describe symptoms. You must click the next button to save text entered. ---Daughter is in another state and is concerned that she may be addicted to her pain medication for her neck. She is taking 8 pills a day. She thinks she is taking 2 pills at a time, but pt wont tell her dtr straight answer. Dtr doesnt know name of medication. See above note - she is on release form. PT is in [place]. Pt is exhausted and others in [place] have concerns about amount she is taking. PT had MD appointment yesterday and Caller wants to ask provider if pt is reporting everything to NP Has the patient traveled out of the country within the last 30 days? ---No Does the patient have any new or worsening symptoms? ---Colbert Ewing a triage be completed? ---No Select reason for no triage. ---Other Please document clinical information provided and list any resource used. ---h/o asthma and neck issue but dtr thinks it is DDD and maybe arthritis. Dtr wants to speak her concerns with provider Guidelines Guideline Title Affirmed Question Affirmed Notes Final Disposition User Clinical Call Gaddy, RN, Solmon Ice Comments called office and spoke with triage nurse - reaced Vincent Peyer RN asked if dtr is allowed to discuss her Mom's health. She looked on disignated release form - Renato Gails is on the form - told her will talk with them and call back if needed and send conversation to epic CALLER ([phone]) [place], NP "NONEMERGENTLY" correction - triage  nurse at office is Georgiana Spinner, RN - spoke with her again and updated her on dtrs concerns and request for call back. caller did not want nurse to call pt to include her in this discussion Call Id: [phone]

## 2016-04-05 NOTE — Telephone Encounter (Signed)
Called and spoke with Kellie Shropshire, discussed concerns.

## 2016-04-17 ENCOUNTER — Telehealth: Payer: Self-pay | Admitting: Primary Care

## 2016-04-17 DIAGNOSIS — J4540 Moderate persistent asthma, uncomplicated: Secondary | ICD-10-CM

## 2016-04-17 NOTE — Telephone Encounter (Addendum)
-----   Message from Pleas Koch, NP sent at 04/03/2016  5:50 PM EDT ----- Regarding: Pulmicort Was she able to purchase the Pulmicort inhaler? Has she noticed an improvement in asthma symptoms since starting the Pulmicort inhaler?

## 2016-04-23 NOTE — Telephone Encounter (Signed)
Message left for patient to return my call on 04/19/2016.  Did a prior auth for this medication on 04/05/2016.

## 2016-04-25 MED ORDER — FLUTICASONE PROPIONATE HFA 44 MCG/ACT IN AERO: 2.0000 | INHALATION_SPRAY | Freq: Two times a day (BID) | RESPIRATORY_TRACT | 1 refills | Status: DC

## 2016-04-25 NOTE — Telephone Encounter (Signed)
Please notify patient that I've sent in another inhaler called Flovent. She is to inhale 2 puffs into the lungs twice daily. We will check in on her in several weeks.

## 2016-04-25 NOTE — Telephone Encounter (Signed)
Spoken to [facility] and re-do the prior auth on 04/24/2016.  Did speak to patient earlier in the day and she did not pick up inhaler.

## 2016-04-25 NOTE — Telephone Encounter (Signed)
Spoken and notified patient of [name]'s comments. Patient verbalized understanding. 

## 2016-04-25 NOTE — Telephone Encounter (Signed)
Received respond from [facility] via fax that prior auth for Pulmicort 180 mcg Flexhaler was denied coverage. Please advise.

## 2016-05-09 ENCOUNTER — Telehealth: Payer: Self-pay | Admitting: Primary Care

## 2016-05-09 NOTE — Telephone Encounter (Signed)
-----   Message from Pleas Koch, NP sent at 04/25/2016 12:19 PM EDT ----- Regarding: Asthma Any improvement since starting Flovent inhaler?

## 2016-05-09 NOTE — Telephone Encounter (Signed)
Message left for patient to return my call.  

## 2016-05-13 NOTE — Telephone Encounter (Signed)
Message left for patient to return my call.  Also sending patient a MyChart message.

## 2016-05-30 ENCOUNTER — Encounter: Payer: Self-pay | Admitting: *Deleted

## 2016-05-31 ENCOUNTER — Ambulatory Visit: Payer: Medicare Other | Admitting: Anesthesiology

## 2016-05-31 ENCOUNTER — Encounter
Admission: RE | Disposition: A | Discharge status: Home / Self Care | Payer: Self-pay | Source: Ambulatory Visit | Attending: Gastroenterology

## 2016-05-31 ENCOUNTER — Encounter: Payer: Self-pay | Admitting: *Deleted

## 2016-05-31 ENCOUNTER — Ambulatory Visit
Admission: RE | Admit: 2016-05-31 | Discharge: 2016-05-31 | Disposition: A | Discharge status: Home / Self Care | Payer: Medicare Other | Source: Ambulatory Visit | Attending: Gastroenterology | Admitting: Gastroenterology

## 2016-05-31 DIAGNOSIS — K5190 Ulcerative colitis, unspecified, without complications: Secondary | ICD-10-CM | POA: Diagnosis not present

## 2016-05-31 DIAGNOSIS — J45909 Unspecified asthma, uncomplicated: Secondary | ICD-10-CM | POA: Insufficient documentation

## 2016-05-31 DIAGNOSIS — I10 Essential (primary) hypertension: Secondary | ICD-10-CM | POA: Diagnosis not present

## 2016-05-31 DIAGNOSIS — G4700 Insomnia, unspecified: Secondary | ICD-10-CM | POA: Diagnosis not present

## 2016-05-31 DIAGNOSIS — F419 Anxiety disorder, unspecified: Secondary | ICD-10-CM | POA: Insufficient documentation

## 2016-05-31 DIAGNOSIS — Q438 Other specified congenital malformations of intestine: Secondary | ICD-10-CM | POA: Diagnosis not present

## 2016-05-31 DIAGNOSIS — K5100 Ulcerative (chronic) pancolitis without complications: Secondary | ICD-10-CM | POA: Diagnosis not present

## 2016-05-31 DIAGNOSIS — F329 Major depressive disorder, single episode, unspecified: Secondary | ICD-10-CM | POA: Insufficient documentation

## 2016-05-31 DIAGNOSIS — Z79899 Other long term (current) drug therapy: Secondary | ICD-10-CM | POA: Insufficient documentation

## 2016-05-31 DIAGNOSIS — K51819 Other ulcerative colitis with unspecified complications: Secondary | ICD-10-CM | POA: Diagnosis not present

## 2016-05-31 HISTORY — PX: COLONOSCOPY WITH PROPOFOL: SHX5780

## 2016-05-31 HISTORY — DX: Insomnia, unspecified: G47.00

## 2016-05-31 SURGERY — COLONOSCOPY WITH PROPOFOL
Anesthesia: General

## 2016-05-31 MED ORDER — PROPOFOL 10 MG/ML IV BOLUS
INTRAVENOUS | Status: DC | PRN
  Administered 2016-05-31: 50 mg via INTRAVENOUS

## 2016-05-31 MED ORDER — PROPOFOL 500 MG/50ML IV EMUL
INTRAVENOUS | Status: DC | PRN
  Administered 2016-05-31: 150 ug/kg/min via INTRAVENOUS

## 2016-05-31 MED ORDER — MIDAZOLAM HCL 2 MG/2ML IJ SOLN
INTRAMUSCULAR | Status: DC | PRN
  Administered 2016-05-31: 1 mg via INTRAVENOUS

## 2016-05-31 MED ORDER — LIDOCAINE HCL (CARDIAC) 20 MG/ML IV SOLN
INTRAVENOUS | Status: DC | PRN
Start: 2016-05-31 — End: 2016-05-31
  Administered 2016-05-31: 60 mg via INTRAVENOUS

## 2016-05-31 MED ORDER — SODIUM CHLORIDE 0.9 % IV SOLN
INTRAVENOUS | Status: DC
  Administered 2016-05-31: 09:00:00 via INTRAVENOUS

## 2016-05-31 NOTE — Anesthesia Postprocedure Evaluation (Signed)
Anesthesia Post Note  Patient: Katelyn Dillon  Procedure(s) Performed: Procedure(s) (LRB): COLONOSCOPY WITH PROPOFOL (N/A)  Patient location during evaluation: Endoscopy Anesthesia Type: General Level of consciousness: awake and alert Pain management: pain level controlled Vital Signs Assessment: post-procedure vital signs reviewed and stable Respiratory status: spontaneous breathing and respiratory function stable Cardiovascular status: stable Anesthetic complications: no    Last Vitals:  Vitals:   05/31/16 0836 05/31/16 1024  BP: (!) 163/83 138/80  Pulse: 93 72  Resp: 14 12  Temp: 36.4 C (!) 36.1 C    Last Pain:  Vitals:   05/31/16 1024  TempSrc: Tympanic                 KEPHART,WILLIAM K

## 2016-05-31 NOTE — Transfer of Care (Signed)
Immediate Anesthesia Transfer of Care Note  Patient: Katelyn Dillon  Procedure(s) Performed: Procedure(s): COLONOSCOPY WITH PROPOFOL (N/A)  Patient Location: Endoscopy Unit  Anesthesia Type:General  Level of Consciousness: awake and patient cooperative  Airway & Oxygen Therapy: Patient Spontanous Breathing and Patient connected to nasal cannula oxygen  Post-op Assessment: Report given to RN, Post -op Vital signs reviewed and stable and Patient moving all extremities X 4  Post vital signs: Reviewed and stable  Last Vitals:  Vitals:   05/31/16 0836 05/31/16 1024  BP: (!) 163/83 (P) 138/80  Pulse: 93 (P) 72  Resp: 14 (P) 12  Temp: 36.4 C (!) (P) 36.1 C    Last Pain:  Vitals:   05/31/16 1024  TempSrc: (P) Tympanic         Complications: No apparent anesthesia complications

## 2016-05-31 NOTE — Op Note (Signed)
[facility] Patient Name: Katelyn Dillon Procedure Date: 05/31/2016 9:34 AM MRN: [id number] Account #: [id number] Date of Birth: 11/06/1940 Admit Type: Outpatient Age: 75 Room: [facility] ENDO ROOM 3 Gender: Female Note Status: Finalized Procedure:            Colonoscopy Indications:          Personal history of ulcerative colitis Providers:            Lollie Sails, MD Referring MD:         Pleas Koch (Referring MD) Medicines:            Monitored Anesthesia Care Complications:        No immediate complications. Procedure:            Pre-Anesthesia Assessment:                       - ASA Grade Assessment: II - A patient with mild                        systemic disease.                       After obtaining informed consent, the colonoscope was                        passed under direct vision. Throughout the procedure,                        the patient's blood pressure, pulse, and oxygen                        saturations were monitored continuously. The                        Colonoscope was introduced through the anus and                        advanced to the the cecum, identified by appendiceal                        orifice and ileocecal valve. The colonoscopy was                        performed with moderate difficulty due to a tortuous                        colon. Successful completion of the procedure was aided                        by changing the patient to a supine position, changing                        the patient to a prone position and using manual                        pressure. The patient tolerated the procedure well. The                        quality of the bowel preparation was good. Findings:      The sigmoid colon, descending colon  and transverse colon were       significantly redundant.      Inflammation characterized by congestion (edema), erythema, friability       and granularity was found in a continuous  and circumferential pattern       from the rectum to the descending colon. This was moderate in severity.       Above the level of the distal descending colon there is a diffuse       granular appearance though not other findings. The entire colon has the       appearance of long term disease, with a decline of haustral fold       prominance throughout. Biopsies were taken with a cold forceps for       histology. Biopsies for histology were taken with a cold forceps from       the cecum, ascending colon, transverse colon, descending colon, sigmoid       colon and rectum for evaluation of microscopic colitis.      there is one patch in the proximal sigmoid/distal descending region that       showed some granularity with delineation and this was biopsied       separately.      The digital rectal exam was normal. Attempt was made to intubate the       terminal eleum. I was able to partially open the valve, this appearing       normal, though could not completely intubate the TI. Impression:           - Redundant colon.                       - Pancolitis. Inflammation was found from the rectum to                        the descending colon. This was moderate in severity.                        Biopsied. Recommendation:       - Discharge patient to home.                       - Await pathology results.                       - Return to GI clinic in 3 weeks.                       - Continue present medications. Procedure Code(s):    --- Professional ---                       [phone], Colonoscopy, flexible; with biopsy, single or                        multiple Diagnosis Code(s):    --- Professional ---                       K52.9, Noninfective gastroenteritis and colitis,                        unspecified                       Z87.19, Personal history of  other diseases of the                        digestive system                       Q43.8, Other specified congenital malformations of                         intestine CPT copyright 2016 American Medical Association. All rights reserved. The codes documented in this report are preliminary and upon coder review may  be revised to meet current compliance requirements. Lollie Sails, MD 05/31/2016 10:28:07 AM This report has been signed electronically. Number of Addenda: 0 Note Initiated On: 05/31/2016 9:34 AM Scope Withdrawal Time: 0 hours 17 minutes 18 seconds  Total Procedure Duration: 0 hours 34 minutes 42 seconds       [facility]

## 2016-05-31 NOTE — H&P (Signed)
Outpatient short stay form Pre-procedure 05/31/2016 4:03 PM Lollie Sails MD  Primary Physician: Alma Friendly NP  Reason for visit:  Colonoscopy  History of present illness:  Patient is a 75 year old female presenting today as above. She has a personal history of ulcerative colitis that was diagnosed probably 30 years ago. She has had multiple colonoscopies. She presented to our clinic as an outpatient on 12/14/2015 to schedule colonoscopy and establish care for ulcerative colitis and also to consider repeat starting anti-TNF therapy. She had previously.on Remicade however was unable to continue due to some insurance constraints and a recent move. He had a colonoscopy scheduled for 01/30/2016 however when that was done she was found to have a poor prep. She is returning today for that procedure. She hasn't taking the elbow 3.6 g a day. She has had several flares over the past year. Currently she has minimal abdominal pain there is some occasional discomfort in the left lower quadrant. He has no nausea or vomiting. She does see mucus in the stools but no blood.  She tolerated her prep well. She takes no blood thinners or aspirin products.   No current facility-administered medications for this encounter.   Current Outpatient Prescriptions:  .  amLODipine-benazepril (LOTREL) 10-20 MG capsule, TAKE 1 CAPSULE BY MOUTH DAILY., Disp: 30 capsule, Rfl: 5 .  budesonide (PULMICORT) 180 MCG/ACT inhaler, Inhale 2 puffs into the lungs 2 (two) times daily., Disp: 1 Inhaler, Rfl: 5 .  budesonide-formoterol (SYMBICORT) 160-4.5 MCG/ACT inhaler, Inhale 2 puffs into the lungs 2 (two) times daily., Disp: , Rfl:  .  buPROPion (WELLBUTRIN XL) 150 MG 24 hr tablet, Take 150 mg by mouth daily., Disp: , Rfl:  .  ergocalciferol (VITAMIN D2) 50000 units capsule, Take 50,000 Units by mouth once a week., Disp: , Rfl:  .  fluticasone (FLOVENT HFA) 44 MCG/ACT inhaler, Inhale 2 puffs into the lungs 2 (two) times daily.,  Disp: 1 Inhaler, Rfl: 1 .  LIALDA 1.2 g EC tablet, TAKE 3 TABLETS (3.6 G TOTAL) BY MOUTH DAILY WITH BREAKFAST., Disp: 90 tablet, Rfl: 4 .  PROAIR HFA 108 (90 Base) MCG/ACT inhaler, INHALE 2 PUFFS INTO THE LUNGS EVERY 6 (SIX) HOURS AS NEEDED FOR WHEEZING OR SHORTNESS OF BREATH., Disp: 8.5 g, Rfl: 5 .  tiZANidine (ZANAFLEX) 4 MG tablet, TAKE 1 TABLET EVERY 6 HOURS AS NEEDED FOR MUSCLE SPASMS, Disp: 90 tablet, Rfl: 1 .  venlafaxine XR (EFFEXOR-XR) 75 MG 24 hr capsule, TAKE 1 CAPSULE (75 MG TOTAL) BY MOUTH DAILY WITH BREAKFAST., Disp: 90 capsule, Rfl: 1 .  potassium chloride (K-DUR,KLOR-CON) 10 MEQ tablet, Take 1 tablet (10 mEq total) by mouth 2 (two) times daily. (Patient not taking: Reported on 04/03/2016), Disp: 10 tablet, Rfl: 0 .  predniSONE (DELTASONE) 20 MG tablet, Take 3 tablets for four days, then 2 tablets for four days, then 1 tablet for four days. (Patient not taking: Reported on 05/31/2016), Disp: 24 tablet, Rfl: 0  No prescriptions prior to admission.     No Known Allergies   Past Medical History:  Diagnosis Date  . Allergy   . Anxiety   . Asthma   . Chicken pox   . Depression   . Hypertension   . Insomnia   . Ulcerative colitis ([place])     Review of systems:      Physical Exam    Heart and lungs: Regular rate and rhythm without rub or gallop lungs are bilaterally clear    HEENT: Normocephalic atraumatic eyes  are anicteric    Other:     Pertinant exam for procedure: Soft nontender nondistended bowel sounds positive normoactive    Planned proceedures: Colonoscopy and indicated procedures. I have discussed the risks benefits and complications of procedures to include not limited to bleeding, infection, perforation and the risk of sedation and the patient wishes to proceed.    Lollie Sails, MD Gastroenterology 05/31/2016  4:03 PM

## 2016-05-31 NOTE — Anesthesia Preprocedure Evaluation (Signed)
Anesthesia Evaluation  Patient identified by MRN, date of birth, ID band Patient awake    Reviewed: Allergy & Precautions, NPO status , Patient's Chart, lab work & pertinent test results  History of Anesthesia Complications Negative for: history of anesthetic complications  Airway Mallampati: II       Dental   Pulmonary asthma ,           Cardiovascular hypertension, Pt. on medications      Neuro/Psych Anxiety Depression negative neurological ROS     GI/Hepatic Neg liver ROS, PUD,   Endo/Other  negative endocrine ROS  Renal/GU negative Renal ROS     Musculoskeletal   Abdominal   Peds  Hematology negative hematology ROS (+)   Anesthesia Other Findings   Reproductive/Obstetrics                             Anesthesia Physical Anesthesia Plan  ASA: II  Anesthesia Plan:    Post-op Pain Management:    Induction: Intravenous  Airway Management Planned: Nasal Cannula  Additional Equipment:   Intra-op Plan:   Post-operative Plan:   Informed Consent: I have reviewed the patients History and Physical, chart, labs and discussed the procedure including the risks, benefits and alternatives for the proposed anesthesia with the patient or authorized representative who has indicated his/her understanding and acceptance.     Plan Discussed with:   Anesthesia Plan Comments:         Anesthesia Quick Evaluation

## 2016-06-03 ENCOUNTER — Encounter: Payer: Self-pay | Admitting: Gastroenterology

## 2016-06-03 LAB — SURGICAL PATHOLOGY

## 2016-06-18 ENCOUNTER — Other Ambulatory Visit: Payer: Self-pay | Admitting: Gastroenterology

## 2016-06-18 DIAGNOSIS — R197 Diarrhea, unspecified: Secondary | ICD-10-CM | POA: Diagnosis not present

## 2016-06-18 DIAGNOSIS — K51911 Ulcerative colitis, unspecified with rectal bleeding: Secondary | ICD-10-CM | POA: Diagnosis not present

## 2016-06-18 DIAGNOSIS — R1084 Generalized abdominal pain: Secondary | ICD-10-CM

## 2016-06-19 ENCOUNTER — Ambulatory Visit
Admission: RE | Admit: 2016-06-19 | Discharge: 2016-06-19 | Disposition: A | Discharge status: Home / Self Care | Payer: Medicare Other | Source: Ambulatory Visit | Attending: Gastroenterology | Admitting: Gastroenterology

## 2016-06-19 ENCOUNTER — Other Ambulatory Visit
Admission: RE | Admit: 2016-06-19 | Discharge: 2016-06-19 | Disposition: A | Discharge status: Home / Self Care | Payer: Medicare Other | Source: Ambulatory Visit | Attending: Gastroenterology | Admitting: Gastroenterology

## 2016-06-19 DIAGNOSIS — R197 Diarrhea, unspecified: Secondary | ICD-10-CM | POA: Diagnosis not present

## 2016-06-19 DIAGNOSIS — M899 Disorder of bone, unspecified: Secondary | ICD-10-CM | POA: Insufficient documentation

## 2016-06-19 DIAGNOSIS — I700 Atherosclerosis of aorta: Secondary | ICD-10-CM | POA: Insufficient documentation

## 2016-06-19 DIAGNOSIS — R1084 Generalized abdominal pain: Secondary | ICD-10-CM | POA: Insufficient documentation

## 2016-06-19 DIAGNOSIS — K51911 Ulcerative colitis, unspecified with rectal bleeding: Secondary | ICD-10-CM | POA: Insufficient documentation

## 2016-06-19 DIAGNOSIS — R109 Unspecified abdominal pain: Secondary | ICD-10-CM | POA: Diagnosis not present

## 2016-06-19 LAB — C DIFFICILE QUICK SCREEN W PCR REFLEX
C DIFFICILE (CDIFF) TOXIN: NEGATIVE
C DIFFICLE (CDIFF) ANTIGEN: NEGATIVE
C Diff interpretation: NOT DETECTED

## 2016-06-19 LAB — GASTROINTESTINAL PANEL BY PCR, STOOL (REPLACES STOOL CULTURE)

## 2016-06-19 MED ORDER — IOPAMIDOL (ISOVUE-300) INJECTION 61%
100.0000 mL | Freq: Once | INTRAVENOUS | Status: AC | PRN
  Administered 2016-06-19: 100 mL via INTRAVENOUS

## 2016-06-24 ENCOUNTER — Other Ambulatory Visit: Payer: Self-pay | Admitting: Primary Care

## 2016-06-24 DIAGNOSIS — J4540 Moderate persistent asthma, uncomplicated: Secondary | ICD-10-CM

## 2016-07-09 DIAGNOSIS — K51511 Left sided colitis with rectal bleeding: Secondary | ICD-10-CM | POA: Diagnosis not present

## 2016-07-10 ENCOUNTER — Other Ambulatory Visit
Admission: RE | Admit: 2016-07-10 | Discharge: 2016-07-10 | Disposition: A | Discharge status: Home / Self Care | Payer: Medicare Other | Source: Ambulatory Visit | Attending: Gastroenterology | Admitting: Gastroenterology

## 2016-07-10 ENCOUNTER — Ambulatory Visit
Admission: RE | Admit: 2016-07-10 | Discharge: 2016-07-10 | Disposition: A | Discharge status: Home / Self Care | Payer: Medicare Other | Source: Ambulatory Visit | Attending: Gastroenterology | Admitting: Gastroenterology

## 2016-07-10 ENCOUNTER — Other Ambulatory Visit: Payer: Self-pay | Admitting: Gastroenterology

## 2016-07-10 DIAGNOSIS — K51511 Left sided colitis with rectal bleeding: Secondary | ICD-10-CM

## 2016-07-10 DIAGNOSIS — K625 Hemorrhage of anus and rectum: Secondary | ICD-10-CM | POA: Diagnosis not present

## 2016-07-10 LAB — GASTROINTESTINAL PANEL BY PCR, STOOL (REPLACES STOOL CULTURE)
Adenovirus F40/41: NOT DETECTED
Astrovirus: NOT DETECTED
CAMPYLOBACTER SPECIES: NOT DETECTED
CRYPTOSPORIDIUM: NOT DETECTED
CYCLOSPORA CAYETANENSIS: NOT DETECTED
Entamoeba histolytica: NOT DETECTED
Enteroaggregative E coli (EAEC): NOT DETECTED
Enteropathogenic E coli (EPEC): NOT DETECTED
Enterotoxigenic E coli (ETEC): NOT DETECTED
Giardia lamblia: NOT DETECTED
Norovirus GI/GII: NOT DETECTED
PLESIMONAS SHIGELLOIDES: NOT DETECTED
Rotavirus A: NOT DETECTED
SAPOVIRUS (I, II, IV, AND V): NOT DETECTED
SHIGA LIKE TOXIN PRODUCING E COLI (STEC): NOT DETECTED
Salmonella species: NOT DETECTED
Shigella/Enteroinvasive E coli (EIEC): NOT DETECTED
VIBRIO SPECIES: NOT DETECTED
Vibrio cholerae: NOT DETECTED
YERSINIA ENTEROCOLITICA: NOT DETECTED

## 2016-07-10 LAB — C DIFFICILE QUICK SCREEN W PCR REFLEX
C Diff antigen: POSITIVE — AB
C Diff toxin: NEGATIVE

## 2016-07-10 LAB — CLOSTRIDIUM DIFFICILE BY PCR: Toxigenic C. Difficile by PCR: POSITIVE — AB

## 2016-07-16 DIAGNOSIS — K51919 Ulcerative colitis, unspecified with unspecified complications: Secondary | ICD-10-CM | POA: Diagnosis not present

## 2016-07-30 ENCOUNTER — Other Ambulatory Visit: Payer: Self-pay | Admitting: Primary Care

## 2016-07-30 DIAGNOSIS — M62838 Other muscle spasm: Secondary | ICD-10-CM

## 2016-07-30 NOTE — Telephone Encounter (Signed)
Ok to refill? Electronically refill request for   tiZANidine (ZANAFLEX) 4 MG tablet  Last prescribed on 03/26/2016. Last seen on 04/03/2016.

## 2016-09-10 ENCOUNTER — Ambulatory Visit (INDEPENDENT_AMBULATORY_CARE_PROVIDER_SITE_OTHER): Payer: Medicare Other | Admitting: Primary Care

## 2016-09-10 ENCOUNTER — Encounter: Payer: Self-pay | Admitting: Primary Care

## 2016-09-10 DIAGNOSIS — J4520 Mild intermittent asthma, uncomplicated: Secondary | ICD-10-CM | POA: Diagnosis not present

## 2016-09-10 DIAGNOSIS — K51919 Ulcerative colitis, unspecified with unspecified complications: Secondary | ICD-10-CM | POA: Diagnosis not present

## 2016-09-10 DIAGNOSIS — F419 Anxiety disorder, unspecified: Secondary | ICD-10-CM

## 2016-09-10 DIAGNOSIS — I10 Essential (primary) hypertension: Secondary | ICD-10-CM

## 2016-09-10 DIAGNOSIS — G4700 Insomnia, unspecified: Secondary | ICD-10-CM

## 2016-09-10 DIAGNOSIS — F418 Other specified anxiety disorders: Secondary | ICD-10-CM | POA: Diagnosis not present

## 2016-09-10 DIAGNOSIS — M542 Cervicalgia: Secondary | ICD-10-CM

## 2016-09-10 DIAGNOSIS — G8929 Other chronic pain: Secondary | ICD-10-CM | POA: Diagnosis not present

## 2016-09-10 DIAGNOSIS — F329 Major depressive disorder, single episode, unspecified: Secondary | ICD-10-CM

## 2016-09-10 MED ORDER — PAROXETINE HCL 20 MG PO TABS: 20.0000 mg | ORAL_TABLET | Freq: Every day | ORAL | 1 refills | Status: DC

## 2016-09-10 MED ORDER — PREDNISONE 20 MG PO TABS: ORAL_TABLET | ORAL | 0 refills | Status: DC

## 2016-09-10 NOTE — Assessment & Plan Note (Signed)
Stable on Tizanidine, using sparingly.

## 2016-09-10 NOTE — Progress Notes (Signed)
Subjective:    Patient ID: Katelyn Dillon, female    DOB: [birth year], 76 y.o.   MRN: [id number]  HPI  Katelyn Dillon is a 76 year old female who presents today for follow up.  1) Chronic Asthma: Currently managed on Flovent HFA 44 mcg inhaler. Overall she's doing well, using her albuterol sparingly. She feels that her anxiety is well controlled and denies symptoms of shortness of breath and wheezing.   2) Anxiety and Depression: Currently managed on Effexor XR 75 mg. During our last visit in October 2017 she hadn't had her Wellbutrin XL in several weeks as it caused vivid dreams that were intolerable.   Starting in December 2017 she's experienced increased symptoms of depression including no motivation to do anything, sleeps throughout the day and night, didn't want to visit with family when visiting out of state, enjoys being alone. She's had a lot of family, medical, and personal stress since December 2017. PHQ 9 score of 17 today. She doesn't feel as though the Effexor is helping.   3) Chronic Neck Pain: Overall stable. Currently managed on tizanidine 4 mg for which she uses 1-2 times weekly, some weeks without use. She denies numbness/tingling.    4) Essential Hypertension: Currently managed on Lotrel 10/20 mg. Her BP in the office today is above goal. She has been under a lot of stress at home and thinks her BP is elevated because of this. She is also experiencing a flare of her UC.   5) Ulcerative Colitis: Currently following with Dr. Jacqulyn Liner with GI. Colonoscopy in early December 2017. Did undergo treatment for weeks for GI infection, she has since recovered. She is seeing GI frequently. She will be seeing a specialist through [facility] within the next several weeks. She tried to go on Humira but this was too costly. She's been off of her Lialda as it was too costly and didn't help with symptoms. She's had no flares since December/early January 2018. She has a recent flare that began 2  days ago with bloody/mucousy stools and abdominal pain. She denies nausea, vomiting, fevers. She tried contacting her GI doctor but they are closed.   Review of Systems  Constitutional: Positive for fatigue. Negative for fever.  Respiratory: Negative for shortness of breath and wheezing.   Cardiovascular: Negative for chest pain.  Gastrointestinal: Positive for abdominal pain and blood in stool. Negative for nausea.  Musculoskeletal:       Chronic neck pain overall stable.  Psychiatric/Behavioral: Negative for suicidal ideas.       See HPI       Past Medical History:  Diagnosis Date  . Allergy   . Anxiety   . Asthma   . Chicken pox   . Depression   . Hypertension   . Insomnia   . Ulcerative colitis [facility])      Social History   Social History  . Marital status: Single    Spouse name: N/A  . Number of children: N/A  . Years of education: N/A   Occupational History  . Not on file.   Social History Main Topics  . Smoking status: Never Smoker  . Smokeless tobacco: Never Used  . Alcohol use 0.0 oz/week     Comment: social  . Drug use: No  . Sexual activity: Not on file   Other Topics Concern  . Not on file   Social History Narrative   Widowed. In a relationship.   Three children.   Recently  moved from [place].   Enjoys working outdoors, sewing.    Past Surgical History:  Procedure Laterality Date  . ABDOMINAL HYSTERECTOMY  2000  . COLONOSCOPY WITH PROPOFOL N/A 02/13/2016   Procedure: COLONOSCOPY WITH PROPOFOL;  Surgeon: Lollie Sails, MD;  Location: [facility] ENDOSCOPY;  Service: Endoscopy;  Laterality: N/A;  . COLONOSCOPY WITH PROPOFOL N/A 05/31/2016   Procedure: COLONOSCOPY WITH PROPOFOL;  Surgeon: Lollie Sails, MD;  Location: [facility] ENDOSCOPY;  Service: Endoscopy;  Laterality: N/A;  . JOINT REPLACEMENT Right 2013  . REPLACEMENT UNICONDYLAR JOINT KNEE      Family History  Problem Relation Age of Onset  . Hypertension Mother   . Hypertension Father      No Known Allergies  Current Outpatient Prescriptions on File Prior to Visit  Medication Sig Dispense Refill  . amLODipine-benazepril (LOTREL) 10-20 MG capsule TAKE 1 CAPSULE BY MOUTH DAILY. 30 capsule 5  . FLOVENT HFA 44 MCG/ACT inhaler INHALE 2 PUFFS INTO LUNGS TWO TIMES A DAY 10.6 g 2  . tiZANidine (ZANAFLEX) 4 MG tablet TAKE ONE TABLET BY MOUTH EVERY 6 HOURS AS NEEDED FOR MUSCLE SPASMS 90 tablet 0  . venlafaxine XR (EFFEXOR-XR) 75 MG 24 hr capsule TAKE 1 CAPSULE (75 MG TOTAL) BY MOUTH DAILY WITH BREAKFAST. 90 capsule 1   No current facility-administered medications on file prior to visit.     BP (!) 146/84   Pulse 88   Temp 97.7 F (36.5 C) (Oral)   Ht 5' 2"  (1.575 m)   Wt 123 lb 6.4 oz (56 kg)   SpO2 98%   BMI 22.57 kg/m    Objective:   Physical Exam  Constitutional: She appears well-nourished. She does not appear ill.  Neck: Neck supple.  Cardiovascular: Normal rate and regular rhythm.   Pulmonary/Chest: Effort normal and breath sounds normal.  Abdominal: Soft. Bowel sounds are normal. There is no tenderness.  Skin: Skin is warm and dry.  Psychiatric: She has a normal mood and affect.          Assessment & Plan:

## 2016-09-10 NOTE — Assessment & Plan Note (Addendum)
Uncontrolled.  Failed treatment on Prozac years ago. Did do well temporarily on Zoloft in the past, this became ineffective eventually. Wellbutrin caused vivid dreams, Effexor is clearly not effective. PHQ 9 score of 17 today, denies SI/HI.  Will start Paxil 20 mg tablets, stop Effexor. Consider psychiatry if no improvement after 6 weeks of treatment as she's failed several treatment regimens. She continues to decline therapy referral.   Follow up in 6 weeks for re-evaluation.

## 2016-09-10 NOTE — Assessment & Plan Note (Signed)
Slightly above goal today, suspect this is secondary to depression/stress and UC flare. Continue to monitor.

## 2016-09-10 NOTE — Progress Notes (Signed)
Pre visit review using our clinic review tool, if applicable. No additional management support is needed unless otherwise documented below in the visit note. 

## 2016-09-10 NOTE — Assessment & Plan Note (Signed)
No problem with this, now sleeping too much secondary to depression.

## 2016-09-10 NOTE — Assessment & Plan Note (Signed)
Stable on Flovent, uses albuterol sparingly. No wheezing on exam. Continue current regimen.

## 2016-09-10 NOTE — Assessment & Plan Note (Signed)
Current flare x 2 days. Still following with GI. Will treat acute flare with prednisone regimen, strongly encouraged her to contact GI to notify once they re-open.

## 2016-09-10 NOTE — Patient Instructions (Signed)
I will contact you soon regarding our plan for treatment of anxiety and depression.  I sent a refill of Prednisone to your pharmacy for the current flare. Please get a hold of your GI doctor to notify her of the recent flare.   It was a pleasure to see you today!

## 2016-09-20 ENCOUNTER — Other Ambulatory Visit
Admission: RE | Admit: 2016-09-20 | Discharge: 2016-09-20 | Disposition: A | Discharge status: Home / Self Care | Payer: Medicare Other | Source: Ambulatory Visit | Attending: Gastroenterology | Admitting: Gastroenterology

## 2016-09-20 DIAGNOSIS — K5190 Ulcerative colitis, unspecified, without complications: Secondary | ICD-10-CM | POA: Diagnosis not present

## 2016-09-27 LAB — MISCELLANEOUS TEST

## 2016-10-03 ENCOUNTER — Other Ambulatory Visit: Payer: Self-pay | Admitting: Primary Care

## 2016-10-03 DIAGNOSIS — M62838 Other muscle spasm: Secondary | ICD-10-CM

## 2016-10-03 MED ORDER — TIZANIDINE HCL 4 MG PO TABS: ORAL_TABLET | ORAL | 1 refills | Status: DC

## 2016-10-03 NOTE — Telephone Encounter (Signed)
Refilled Rx.

## 2016-10-03 NOTE — Telephone Encounter (Signed)
Ok to refill? Electronically refill request for tiZANidine (ZANAFLEX) 4 MG tablet. Last prescribed on 07/30/2016. Last seen on 09/10/2016.

## 2016-10-08 DIAGNOSIS — K51919 Ulcerative colitis, unspecified with unspecified complications: Secondary | ICD-10-CM | POA: Diagnosis not present

## 2016-10-15 DIAGNOSIS — K51919 Ulcerative colitis, unspecified with unspecified complications: Secondary | ICD-10-CM | POA: Diagnosis not present

## 2016-10-22 DIAGNOSIS — K51919 Ulcerative colitis, unspecified with unspecified complications: Secondary | ICD-10-CM | POA: Diagnosis not present

## 2016-10-29 ENCOUNTER — Other Ambulatory Visit: Payer: Self-pay | Admitting: Primary Care

## 2016-10-29 DIAGNOSIS — K5180 Other ulcerative colitis without complications: Secondary | ICD-10-CM | POA: Diagnosis not present

## 2016-10-29 DIAGNOSIS — K51919 Ulcerative colitis, unspecified with unspecified complications: Secondary | ICD-10-CM | POA: Diagnosis not present

## 2016-10-29 DIAGNOSIS — F419 Anxiety disorder, unspecified: Principal | ICD-10-CM

## 2016-10-29 DIAGNOSIS — F329 Major depressive disorder, single episode, unspecified: Secondary | ICD-10-CM

## 2016-11-01 ENCOUNTER — Other Ambulatory Visit: Payer: Self-pay | Admitting: Primary Care

## 2016-11-01 DIAGNOSIS — I10 Essential (primary) hypertension: Secondary | ICD-10-CM

## 2016-11-01 MED ORDER — AMLODIPINE BESY-BENAZEPRIL HCL 10-20 MG PO CAPS: 1.0000 | ORAL_CAPSULE | Freq: Every day | ORAL | 5 refills | Status: DC

## 2016-11-19 ENCOUNTER — Ambulatory Visit (INDEPENDENT_AMBULATORY_CARE_PROVIDER_SITE_OTHER): Payer: Medicare Other | Admitting: Primary Care

## 2016-11-19 DIAGNOSIS — F32A Depression, unspecified: Secondary | ICD-10-CM

## 2016-11-19 DIAGNOSIS — F329 Major depressive disorder, single episode, unspecified: Secondary | ICD-10-CM

## 2016-11-19 DIAGNOSIS — F419 Anxiety disorder, unspecified: Secondary | ICD-10-CM | POA: Diagnosis not present

## 2016-11-19 DIAGNOSIS — J4540 Moderate persistent asthma, uncomplicated: Secondary | ICD-10-CM | POA: Diagnosis not present

## 2016-11-19 MED ORDER — PAROXETINE HCL 20 MG PO TABS: 20.0000 mg | ORAL_TABLET | Freq: Every day | ORAL | 0 refills | Status: DC

## 2016-11-19 MED ORDER — BUSPIRONE HCL 7.5 MG PO TABS: 7.5000 mg | ORAL_TABLET | Freq: Two times a day (BID) | ORAL | 1 refills | Status: DC

## 2016-11-19 MED ORDER — FLUTICASONE PROPIONATE HFA 44 MCG/ACT IN AERO: INHALATION_SPRAY | RESPIRATORY_TRACT | 11 refills | Status: DC

## 2016-11-19 MED ORDER — ALBUTEROL SULFATE HFA 108 (90 BASE) MCG/ACT IN AERS: 2.0000 | INHALATION_SPRAY | RESPIRATORY_TRACT | 5 refills | Status: DC | PRN
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
## 2016-11-19 NOTE — Assessment & Plan Note (Addendum)
Improvement with Paxil for 3 weeks, now feels the same prior to initiation of Paxil. Given several failed attempts on SSRI's and SNRI, will add in Buspar. Continue same dose of Paxil. Follow up in 4-6 weeks for re-evaluation. If no improvement then will send to psychiatry.

## 2016-11-19 NOTE — Patient Instructions (Signed)
Continue paroxetine 20 mg tablets for anxiety and depression.  Start buspirone (Buspar) 7.5 mg twice daily for anxiety. Start by taking 1 tablet once daily for 1 week, then increase to 1 tablet twice daily thereafter.  Follow up in 4-6 weeks for re-evaluation. Message me sooner if you have any problems.  It was a pleasure to see you today!

## 2016-11-20 DIAGNOSIS — D473 Essential (hemorrhagic) thrombocythemia: Secondary | ICD-10-CM | POA: Diagnosis not present

## 2016-11-30 ENCOUNTER — Other Ambulatory Visit: Payer: Self-pay | Admitting: Primary Care

## 2016-11-30 DIAGNOSIS — F329 Major depressive disorder, single episode, unspecified: Secondary | ICD-10-CM

## 2016-11-30 DIAGNOSIS — F419 Anxiety disorder, unspecified: Principal | ICD-10-CM

## 2016-12-11 ENCOUNTER — Institutional Professional Consult (permissible substitution): Payer: Medicare Other | Admitting: Family Medicine

## 2016-12-16 ENCOUNTER — Ambulatory Visit (INDEPENDENT_AMBULATORY_CARE_PROVIDER_SITE_OTHER): Payer: Medicare Other | Admitting: Family Medicine

## 2016-12-16 ENCOUNTER — Ambulatory Visit (INDEPENDENT_AMBULATORY_CARE_PROVIDER_SITE_OTHER)
Admission: RE | Admit: 2016-12-16 | Discharge: 2016-12-16 | Disposition: A | Discharge status: Home / Self Care | Payer: Medicare Other | Source: Ambulatory Visit | Attending: Family Medicine | Admitting: Family Medicine

## 2016-12-16 ENCOUNTER — Encounter: Payer: Self-pay | Admitting: Family Medicine

## 2016-12-16 VITALS — BP 130/76 | HR 79 | Temp 98.0°F | Ht 62.0 in | Wt 126.8 lb

## 2016-12-16 DIAGNOSIS — M542 Cervicalgia: Secondary | ICD-10-CM | POA: Diagnosis not present

## 2016-12-16 DIAGNOSIS — M546 Pain in thoracic spine: Secondary | ICD-10-CM

## 2016-12-16 DIAGNOSIS — R29898 Other symptoms and signs involving the musculoskeletal system: Secondary | ICD-10-CM | POA: Diagnosis not present

## 2016-12-16 DIAGNOSIS — S3992XA Unspecified injury of lower back, initial encounter: Secondary | ICD-10-CM | POA: Diagnosis not present

## 2016-12-16 DIAGNOSIS — M50221 Other cervical disc displacement at C4-C5 level: Secondary | ICD-10-CM | POA: Diagnosis not present
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
## 2016-12-18 ENCOUNTER — Ambulatory Visit (INDEPENDENT_AMBULATORY_CARE_PROVIDER_SITE_OTHER): Payer: Medicare Other | Admitting: Primary Care

## 2016-12-18 ENCOUNTER — Encounter: Payer: Self-pay | Admitting: Primary Care

## 2016-12-18 VITALS — BP 124/80 | HR 76 | Temp 98.2°F | Ht 62.0 in | Wt 126.0 lb

## 2016-12-18 DIAGNOSIS — F329 Major depressive disorder, single episode, unspecified: Secondary | ICD-10-CM | POA: Diagnosis not present

## 2016-12-18 DIAGNOSIS — F419 Anxiety disorder, unspecified: Secondary | ICD-10-CM

## 2016-12-18 DIAGNOSIS — J4540 Moderate persistent asthma, uncomplicated: Secondary | ICD-10-CM | POA: Diagnosis not present

## 2016-12-18 MED ORDER — FLUTICASONE PROPIONATE HFA 44 MCG/ACT IN AERO: INHALATION_SPRAY | RESPIRATORY_TRACT | 11 refills | Status: DC

## 2016-12-18 MED ORDER — PAROXETINE HCL 40 MG PO TABS: 40.0000 mg | ORAL_TABLET | ORAL | 0 refills | Status: DC

## 2016-12-18 NOTE — Assessment & Plan Note (Signed)
Doing well on Flovent, refills sent to pharmacy.

## 2016-12-18 NOTE — Assessment & Plan Note (Signed)
No improvement with addition of Buspar. Will increase paroxetine dose to 40 mg. Referral placed to psychiatry given numerous failed attempts on SSRI's, SNRI's, Wellbutrin, and Buspar. She verbalized understanding. Denies SI/HI.

## 2016-12-18 NOTE — Progress Notes (Signed)
Subjective:    Patient ID: Katelyn Dillon, female    DOB: 1940/11/09, 76 y.o.   MRN: [id number]  HPI  Katelyn Dillon is a 76 year old female with a history of anxiety and depression who presents today to discuss medications. She is currently managed on paroxetine 20 mg and buspirone 7.5 mg BID. During her last visit she endorsed no improvement with paroxetine therefore buspirone was added.  Since her last visit she's not noticed any improvement. She's sitting around for most of the day watching TV. If her husband wasn't at home she wouldn't get out of bed. She's tired of not feeling like doing anything. She denies suicidal thoughts.   Review of Systems  Constitutional: Positive for fatigue.  Respiratory: Negative for shortness of breath.   Cardiovascular: Negative for chest pain.  Neurological: Negative for headaches.  Psychiatric/Behavioral: Negative for sleep disturbance and suicidal ideas.       See HPI       Past Medical History:  Diagnosis Date  . Allergy   . Anxiety   . Asthma   . Chicken pox   . Depression   . Hypertension   . Insomnia   . Ulcerative colitis [facility])      Social History   Social History  . Marital status: Single    Spouse name: N/A  . Number of children: N/A  . Years of education: N/A   Occupational History  . Not on file.   Social History Main Topics  . Smoking status: Never Smoker  . Smokeless tobacco: Never Used  . Alcohol use 0.0 oz/week     Comment: social  . Drug use: No  . Sexual activity: Not on file   Other Topics Concern  . Not on file   Social History Narrative   Widowed. In a relationship.   Three children.   Recently moved from [place].   Enjoys working outdoors, sewing.    Past Surgical History:  Procedure Laterality Date  . ABDOMINAL HYSTERECTOMY  2000  . COLONOSCOPY WITH PROPOFOL N/A 02/13/2016   Procedure: COLONOSCOPY WITH PROPOFOL;  Surgeon: Lollie Sails, MD;  Location: [facility] ENDOSCOPY;  Service: Endoscopy;   Laterality: N/A;  . COLONOSCOPY WITH PROPOFOL N/A 05/31/2016   Procedure: COLONOSCOPY WITH PROPOFOL;  Surgeon: Lollie Sails, MD;  Location: [facility] ENDOSCOPY;  Service: Endoscopy;  Laterality: N/A;  . JOINT REPLACEMENT Right 2013  . REPLACEMENT UNICONDYLAR JOINT KNEE      Family History  Problem Relation Age of Onset  . Hypertension Mother   . Hypertension Father     No Known Allergies  Current Outpatient Prescriptions on File Prior to Visit  Medication Sig Dispense Refill  . albuterol (PROVENTIL HFA;VENTOLIN HFA) 108 (90 Base) MCG/ACT inhaler Inhale 2 puffs into the lungs every 4 (four) hours as needed for wheezing or shortness of breath. 1 Inhaler 5  . amLODipine-benazepril (LOTREL) 10-20 MG capsule Take 1 capsule by mouth daily. 30 capsule 5  . busPIRone (BUSPAR) 7.5 MG tablet Take 1 tablet (7.5 mg total) by mouth 2 (two) times daily. 60 tablet 1  . tiZANidine (ZANAFLEX) 4 MG tablet TAKE ONE TABLET BY MOUTH EVERY 6 HOURS AS NEEDED FOR MUSCLE SPASMS 90 tablet 1   No current facility-administered medications on file prior to visit.     BP 124/80   Pulse 76   Temp 98.2 F (36.8 C) (Oral)   Ht 5' 2"  (1.575 m)   Wt 126 lb (57.2 kg)   SpO2  96%   BMI 23.05 kg/m    Objective:   Physical Exam  Constitutional: She appears well-nourished.  Neck: Neck supple.  Cardiovascular: Normal rate and regular rhythm.   Pulmonary/Chest: Effort normal and breath sounds normal.  Skin: Skin is warm and dry.  Psychiatric:  Cooperative but mood is depressed          Assessment & Plan:

## 2016-12-18 NOTE — Patient Instructions (Addendum)
We've increased your paroxetine from 20 mg to 40 mg. You may take two of the 20 mg tablets until your current bottle runs out. I sent a new prescription for the 40 mg tablets.  I sent refills of your Flovent Inhaler.  You will be contacted regarding your referral to psychiatry.  Please let us know if you have not heard back within one week.   It was a pleasure to see you today!

## 2016-12-22 DIAGNOSIS — D473 Essential (hemorrhagic) thrombocythemia: Secondary | ICD-10-CM | POA: Insufficient documentation

## 2016-12-22 DIAGNOSIS — D75839 Thrombocytosis, unspecified: Secondary | ICD-10-CM | POA: Insufficient documentation
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
## 2016-12-23 ENCOUNTER — Inpatient Hospital Stay: Payer: Medicare Other | Attending: Oncology | Admitting: Oncology

## 2016-12-23 ENCOUNTER — Inpatient Hospital Stay: Payer: Medicare Other

## 2016-12-23 DIAGNOSIS — I10 Essential (primary) hypertension: Secondary | ICD-10-CM | POA: Insufficient documentation

## 2016-12-23 DIAGNOSIS — M5030 Other cervical disc degeneration, unspecified cervical region: Secondary | ICD-10-CM | POA: Diagnosis not present

## 2016-12-23 DIAGNOSIS — D473 Essential (hemorrhagic) thrombocythemia: Secondary | ICD-10-CM

## 2016-12-23 DIAGNOSIS — F329 Major depressive disorder, single episode, unspecified: Secondary | ICD-10-CM | POA: Diagnosis not present

## 2016-12-23 DIAGNOSIS — Z8719 Personal history of other diseases of the digestive system: Secondary | ICD-10-CM | POA: Diagnosis not present

## 2016-12-23 DIAGNOSIS — D75839 Thrombocytosis, unspecified: Secondary | ICD-10-CM

## 2016-12-23 DIAGNOSIS — J45909 Unspecified asthma, uncomplicated: Secondary | ICD-10-CM | POA: Insufficient documentation

## 2016-12-23 DIAGNOSIS — G4700 Insomnia, unspecified: Secondary | ICD-10-CM

## 2016-12-23 DIAGNOSIS — F419 Anxiety disorder, unspecified: Secondary | ICD-10-CM | POA: Diagnosis not present

## 2016-12-23 DIAGNOSIS — M4802 Spinal stenosis, cervical region: Secondary | ICD-10-CM | POA: Diagnosis not present

## 2016-12-23 DIAGNOSIS — Z79899 Other long term (current) drug therapy: Secondary | ICD-10-CM

## 2016-12-23 LAB — IRON AND TIBC
Iron: 60 ug/dL (ref 28–170)
SATURATION RATIOS: 15 % (ref 10.4–31.8)
TIBC: 392 ug/dL (ref 250–450)
UIBC: 332 ug/dL

## 2016-12-23 LAB — CBC
HEMATOCRIT: 36.2 % (ref 35.0–47.0)
HEMOGLOBIN: 12.2 g/dL (ref 12.0–16.0)
MCH: 29.1 pg (ref 26.0–34.0)
MCHC: 33.7 g/dL (ref 32.0–36.0)
MCV: 86.2 fL (ref 80.0–100.0)
Platelets: 467 10*3/uL — ABNORMAL HIGH (ref 150–440)
RBC: 4.2 MIL/uL (ref 3.80–5.20)
RDW: 18.7 % — AB (ref 11.5–14.5)
WBC: 4.5 10*3/uL (ref 3.6–11.0)

## 2016-12-23 LAB — FERRITIN: FERRITIN: 21 ng/mL (ref 11–307)

## 2016-12-23 NOTE — Progress Notes (Signed)
Patient here today for new evaluation regarding thrombocytosis.

## 2016-12-25 LAB — COMP PANEL: LEUKEMIA/LYMPHOMA

## 2016-12-26 LAB — JAK2 GENOTYPR

## 2016-12-30 DIAGNOSIS — S134XXD Sprain of ligaments of cervical spine, subsequent encounter: Secondary | ICD-10-CM | POA: Diagnosis not present

## 2016-12-30 DIAGNOSIS — M542 Cervicalgia: Secondary | ICD-10-CM | POA: Diagnosis not present

## 2017-01-02 DIAGNOSIS — M542 Cervicalgia: Secondary | ICD-10-CM | POA: Diagnosis not present

## 2017-01-02 DIAGNOSIS — S134XXD Sprain of ligaments of cervical spine, subsequent encounter: Secondary | ICD-10-CM | POA: Diagnosis not present

## 2017-01-26 ENCOUNTER — Other Ambulatory Visit: Payer: Self-pay | Admitting: Primary Care

## 2017-01-26 DIAGNOSIS — F419 Anxiety disorder, unspecified: Principal | ICD-10-CM

## 2017-01-26 DIAGNOSIS — F329 Major depressive disorder, single episode, unspecified: Secondary | ICD-10-CM

## 2017-01-26 DIAGNOSIS — F32A Depression, unspecified: Secondary | ICD-10-CM

## 2017-01-27 NOTE — Telephone Encounter (Signed)
Ok to refill? Electronically refill request for busPIRone (BUSPAR) 7.5 MG tablet.  Last prescribed on 11/19/2016. Last seen on 12/18/2016

## 2017-01-27 NOTE — Telephone Encounter (Signed)
Has she established with psychiatry yet?

## 2017-01-27 NOTE — Telephone Encounter (Signed)
Noted  

## 2017-01-27 NOTE — Telephone Encounter (Signed)
Spoken to patient. She stated she have not and she was going to mention this to [place] on Friday as Mr Haeberle has appointment on Friday, 01/31/2017

## 2017-02-12 ENCOUNTER — Other Ambulatory Visit: Payer: Self-pay | Admitting: Primary Care

## 2017-02-12 DIAGNOSIS — M62838 Other muscle spasm: Secondary | ICD-10-CM

## 2017-04-16 DIAGNOSIS — H2513 Age-related nuclear cataract, bilateral: Secondary | ICD-10-CM | POA: Diagnosis not present

## 2017-04-19 NOTE — Progress Notes (Deleted)
[place]  Telephone:(336) [phone] Fax:(336) [phone]  ID: ARITA SEVERTSON OB: 03-07-1941  MR#: [id number]  IPJ#:[id number]  Patient Care Team: Pleas Koch, NP as PCP - General (Nurse Practitioner)  CHIEF COMPLAINT: Thrombocytosis.  INTERVAL HISTORY: Patient is a 76 year old female with a long-standing history of colitis who was noted to have an elevated platelet count on routine blood work. Currently she feels well and is asymptomatic. She states her colitis is under control since initiating 6-MP recently. She has no neurologic complaints. She denies any recent fevers or illnesses. She has good appetite and denies weight loss. She has no chest pain or shortness of breath. She denies any nausea, vomiting, constipation, or diarrhea. She has no melena or hematochezia. She has no urinary complaints. Patient feels at her baseline and offers no specific complaints today.  REVIEW OF SYSTEMS:   Review of Systems  Constitutional: Negative.  Negative for fever, malaise/fatigue and weight loss.  HENT: Negative.   Respiratory: Negative.  Negative for cough.   Cardiovascular: Negative.  Negative for chest pain.  Gastrointestinal: Negative.  Negative for abdominal pain, blood in stool and melena.  Genitourinary: Negative.   Musculoskeletal: Negative.   Skin: Negative.  Negative for rash.  Neurological: Negative.  Negative for weakness.  Psychiatric/Behavioral: Negative.  The patient is not nervous/anxious.     As per HPI. Otherwise, a complete review of systems is negative.  PAST MEDICAL HISTORY: Past Medical History:  Diagnosis Date  . Allergy   . Anxiety   . Asthma   . Chicken pox   . Depression   . Hypertension   . Insomnia   . Ulcerative colitis ([place])     PAST SURGICAL HISTORY: Past Surgical History:  Procedure Laterality Date  . ABDOMINAL HYSTERECTOMY  2000  . COLONOSCOPY WITH PROPOFOL N/A 02/13/2016   Procedure: COLONOSCOPY WITH PROPOFOL;  Surgeon:  Lollie Sails, MD;  Location: [facility] ENDOSCOPY;  Service: Endoscopy;  Laterality: N/A;  . COLONOSCOPY WITH PROPOFOL N/A 05/31/2016   Procedure: COLONOSCOPY WITH PROPOFOL;  Surgeon: Lollie Sails, MD;  Location: [facility] ENDOSCOPY;  Service: Endoscopy;  Laterality: N/A;  . JOINT REPLACEMENT Right 2013  . REPLACEMENT UNICONDYLAR JOINT KNEE      FAMILY HISTORY: Family History  Problem Relation Age of Onset  . Hypertension Mother   . Hypertension Father     ADVANCED DIRECTIVES (Y/N):  N  HEALTH MAINTENANCE: Social History  Substance Use Topics  . Smoking status: Never Smoker  . Smokeless tobacco: Never Used  . Alcohol use 0.0 oz/week     Comment: social     Colonoscopy:  PAP:  Bone density:  Lipid panel:  No Known Allergies  Current Outpatient Prescriptions  Medication Sig Dispense Refill  . albuterol (PROVENTIL HFA;VENTOLIN HFA) 108 (90 Base) MCG/ACT inhaler Inhale 2 puffs into the lungs every 4 (four) hours as needed for wheezing or shortness of breath. 1 Inhaler 5  . amLODipine-benazepril (LOTREL) 10-20 MG capsule Take 1 capsule by mouth daily. 30 capsule 5  . busPIRone (BUSPAR) 7.5 MG tablet take 1 tablet by mouth twice a day 180 tablet 0  . fluticasone (FLOVENT HFA) 44 MCG/ACT inhaler INHALE 2 PUFFS INTO LUNGS TWO TIMES A DAY 10.6 g 11  . PARoxetine (PAXIL) 40 MG tablet Take 1 tablet (40 mg total) by mouth every morning. 90 tablet 0  . tiZANidine (ZANAFLEX) 4 MG tablet take 1 tablet by mouth every 6 hours if needed for muscle spasm 90 tablet 1  No current facility-administered medications for this visit.     OBJECTIVE: There were no vitals filed for this visit.   There is no height or weight on file to calculate BMI.    ECOG FS:0 - Asymptomatic  General: Well-developed, well-nourished, no acute distress. Eyes: Pink conjunctiva, anicteric sclera. HEENT: Normocephalic, moist mucous membranes, clear oropharnyx. Lungs: Clear to auscultation bilaterally. Heart:  Regular rate and rhythm. No rubs, murmurs, or gallops. Abdomen: Soft, nontender, nondistended. No organomegaly noted, normoactive bowel sounds. Musculoskeletal: No edema, cyanosis, or clubbing. Neuro: Alert, answering all questions appropriately. Cranial nerves grossly intact. Skin: No rashes or petechiae noted. Psych: Normal affect. Lymphatics: No cervical, calvicular, axillary or inguinal LAD.   LAB RESULTS:  Lab Results  Component Value Date   NA 140 11/23/2015   K 3.9 11/23/2015   CL 104 11/23/2015   CO2 28 11/23/2015   GLUCOSE 97 11/23/2015   BUN 18 11/23/2015   CREATININE 0.86 11/23/2015   CALCIUM 10.2 11/23/2015   PROT 7.0 02/06/2015   ALBUMIN 4.2 02/06/2015   AST 16 02/06/2015   ALT 11 02/06/2015   ALKPHOS 117 02/06/2015   BILITOT 0.8 02/06/2015    Lab Results  Component Value Date   WBC 4.5 12/23/2016   HGB 12.2 12/23/2016   HCT 36.2 12/23/2016   MCV 86.2 12/23/2016   PLT 467 (H) 12/23/2016     STUDIES: No results found.  ASSESSMENT: Thrombocytosis.  PLAN:    1. Thrombocytosis: Patient platelet count today remains mild, but persistently elevated. The remainder of her blood work from today including iron stores, JAK-2 mutation, and peripheral blood flow cytometry are pending at time of dictation. I suspect the mild elevation is either related to iron deficiency or possibly reactive secondary ongoing inflammation from her underlying colitis. Regardless of the cause, no intervention is needed. Would only initiate treatment if patient's platelet count continued to increase and approached 700-800. She does not require bone marrow biopsy. Return to clinic in 4 months with repeat laboratory work and further evaluation. 2. Hypertension: Patient's blood pressure is elevated today. Continue monitoring and treatment per primary care.  Approximately 45 minutes was spent in discussion of which greater than 50% was consultation.  Patient expressed understanding and was  in agreement with this plan. She also understands that She can call clinic at any time with any questions, concerns, or complaints.    Lloyd Huger, MD   04/19/2017 8:33 AM

## 2017-04-21 ENCOUNTER — Inpatient Hospital Stay: Payer: Medicare Other | Admitting: Oncology

## 2017-04-21 ENCOUNTER — Inpatient Hospital Stay: Payer: Medicare Other

## 2017-04-24 ENCOUNTER — Encounter: Payer: Self-pay | Admitting: Hematology and Oncology

## 2017-04-29 DIAGNOSIS — K51919 Ulcerative colitis, unspecified with unspecified complications: Secondary | ICD-10-CM | POA: Diagnosis not present

## 2017-04-30 ENCOUNTER — Other Ambulatory Visit: Payer: Self-pay | Admitting: Primary Care

## 2017-04-30 DIAGNOSIS — F329 Major depressive disorder, single episode, unspecified: Secondary | ICD-10-CM

## 2017-04-30 DIAGNOSIS — F419 Anxiety disorder, unspecified: Principal | ICD-10-CM

## 2017-04-30 DIAGNOSIS — F32A Depression, unspecified: Secondary | ICD-10-CM

## 2017-04-30 NOTE — Progress Notes (Deleted)
[place]  Telephone:(336) [phone] [phone]) [phone]  ID: LANETTE ELL OB: [birth year]  MR#: [id number]  OBS#:[id number]  Patient Care Team: Pleas Koch, NP as PCP - General (Nurse Practitioner)  CHIEF COMPLAINT: Thrombocytosis.  INTERVAL HISTORY: Patient is a 76 year old female with a long-standing history of colitis who was noted to have an elevated platelet count on routine blood work. Currently she feels well and is asymptomatic. She states her colitis is under control since initiating 6-MP recently. She has no neurologic complaints. She denies any recent fevers or illnesses. She has good appetite and denies weight loss. She has no chest pain or shortness of breath. She denies any nausea, vomiting, constipation, or diarrhea. She has no melena or hematochezia. She has no urinary complaints. Patient feels at her baseline and offers no specific complaints today.  REVIEW OF SYSTEMS:   Review of Systems  Constitutional: Negative.  Negative for fever, malaise/fatigue and weight loss.  HENT: Negative.   Respiratory: Negative.  Negative for cough.   Cardiovascular: Negative.  Negative for chest pain.  Gastrointestinal: Negative.  Negative for abdominal pain, blood in stool and melena.  Genitourinary: Negative.   Musculoskeletal: Negative.   Skin: Negative.  Negative for rash.  Neurological: Negative.  Negative for weakness.  Psychiatric/Behavioral: Negative.  The patient is not nervous/anxious.     As per HPI. Otherwise, a complete review of systems is negative.  PAST MEDICAL HISTORY: Past Medical History:  Diagnosis Date  . Allergy   . Anxiety   . Asthma   . Chicken pox   . Depression   . Hypertension   . Insomnia   . Ulcerative colitis ([place])     PAST SURGICAL HISTORY: Past Surgical History:  Procedure Laterality Date  . ABDOMINAL HYSTERECTOMY  2000  . COLONOSCOPY WITH PROPOFOL N/A 02/13/2016   Procedure: COLONOSCOPY WITH PROPOFOL;  Surgeon:  Lollie Sails, MD;  Location: [facility] ENDOSCOPY;  Service: Endoscopy;  Laterality: N/A;  . COLONOSCOPY WITH PROPOFOL N/A 05/31/2016   Procedure: COLONOSCOPY WITH PROPOFOL;  Surgeon: Lollie Sails, MD;  Location: [facility] ENDOSCOPY;  Service: Endoscopy;  Laterality: N/A;  . JOINT REPLACEMENT Right 2013  . REPLACEMENT UNICONDYLAR JOINT KNEE      FAMILY HISTORY: Family History  Problem Relation Age of Onset  . Hypertension Mother   . Hypertension Father     ADVANCED DIRECTIVES (Y/N):  N  HEALTH MAINTENANCE: Social History  Substance Use Topics  . Smoking status: Never Smoker  . Smokeless tobacco: Never Used  . Alcohol use 0.0 oz/week     Comment: social     Colonoscopy:  PAP:  Bone density:  Lipid panel:  No Known Allergies  Current Outpatient Prescriptions  Medication Sig Dispense Refill  . albuterol (PROVENTIL HFA;VENTOLIN HFA) 108 (90 Base) MCG/ACT inhaler Inhale 2 puffs into the lungs every 4 (four) hours as needed for wheezing or shortness of breath. 1 Inhaler 5  . amLODipine-benazepril (LOTREL) 10-20 MG capsule Take 1 capsule by mouth daily. 30 capsule 5  . busPIRone (BUSPAR) 7.5 MG tablet take 1 tablet by mouth twice a day 180 tablet 0  . fluticasone (FLOVENT HFA) 44 MCG/ACT inhaler INHALE 2 PUFFS INTO LUNGS TWO TIMES A DAY 10.6 g 11  . PARoxetine (PAXIL) 40 MG tablet Take 1 tablet (40 mg total) by mouth every morning. 90 tablet 0  . tiZANidine (ZANAFLEX) 4 MG tablet take 1 tablet by mouth every 6 hours if needed for muscle spasm 90 tablet 1  No current facility-administered medications for this visit.     OBJECTIVE: There were no vitals filed for this visit.   There is no height or weight on file to calculate BMI.    ECOG FS:0 - Asymptomatic  General: Well-developed, well-nourished, no acute distress. Eyes: Pink conjunctiva, anicteric sclera. HEENT: Normocephalic, moist mucous membranes, clear oropharnyx. Lungs: Clear to auscultation bilaterally. Heart:  Regular rate and rhythm. No rubs, murmurs, or gallops. Abdomen: Soft, nontender, nondistended. No organomegaly noted, normoactive bowel sounds. Musculoskeletal: No edema, cyanosis, or clubbing. Neuro: Alert, answering all questions appropriately. Cranial nerves grossly intact. Skin: No rashes or petechiae noted. Psych: Normal affect. Lymphatics: No cervical, calvicular, axillary or inguinal LAD.   LAB RESULTS:  Lab Results  Component Value Date   NA 140 11/23/2015   K 3.9 11/23/2015   CL 104 11/23/2015   CO2 28 11/23/2015   GLUCOSE 97 11/23/2015   BUN 18 11/23/2015   CREATININE 0.86 11/23/2015   CALCIUM 10.2 11/23/2015   PROT 7.0 02/06/2015   ALBUMIN 4.2 02/06/2015   AST 16 02/06/2015   ALT 11 02/06/2015   ALKPHOS 117 02/06/2015   BILITOT 0.8 02/06/2015    Lab Results  Component Value Date   WBC 4.5 12/23/2016   HGB 12.2 12/23/2016   HCT 36.2 12/23/2016   MCV 86.2 12/23/2016   PLT 467 (H) 12/23/2016     STUDIES: No results found.  ASSESSMENT: Thrombocytosis.  PLAN:    1. Thrombocytosis: Patient platelet count today remains mild, but persistently elevated. The remainder of her blood work from today including iron stores, JAK-2 mutation, and peripheral blood flow cytometry are pending at time of dictation. I suspect the mild elevation is either related to iron deficiency or possibly reactive secondary ongoing inflammation from her underlying colitis. Regardless of the cause, no intervention is needed. Would only initiate treatment if patient's platelet count continued to increase and approached 700-800. She does not require bone marrow biopsy. Return to clinic in 4 months with repeat laboratory work and further evaluation. 2. Hypertension: Patient's blood pressure is elevated today. Continue monitoring and treatment per primary care.  Approximately 45 minutes was spent in discussion of which greater than 50% was consultation.  Patient expressed understanding and was  in agreement with this plan. She also understands that She can call clinic at any time with any questions, concerns, or complaints.    Lloyd Huger, MD   04/30/2017 12:56 PM

## 2017-05-05 ENCOUNTER — Inpatient Hospital Stay: Payer: Medicare Other

## 2017-05-05 ENCOUNTER — Inpatient Hospital Stay: Payer: Medicare Other | Admitting: Oncology

## 2017-05-07 ENCOUNTER — Ambulatory Visit: Payer: Medicare Other | Admitting: Psychology

## 2017-05-18 ENCOUNTER — Other Ambulatory Visit: Payer: Self-pay | Admitting: Primary Care

## 2017-05-18 DIAGNOSIS — I10 Essential (primary) hypertension: Secondary | ICD-10-CM

## 2017-05-18 DIAGNOSIS — M62838 Other muscle spasm: Secondary | ICD-10-CM

## 2017-06-06 DIAGNOSIS — H2511 Age-related nuclear cataract, right eye: Secondary | ICD-10-CM | POA: Diagnosis not present

## 2017-06-09 ENCOUNTER — Ambulatory Visit: Payer: Medicare Other | Admitting: Oncology

## 2017-06-09 ENCOUNTER — Other Ambulatory Visit: Payer: Medicare Other

## 2017-06-12 ENCOUNTER — Encounter: Payer: Self-pay | Admitting: *Deleted

## 2017-06-18 ENCOUNTER — Ambulatory Visit (INDEPENDENT_AMBULATORY_CARE_PROVIDER_SITE_OTHER): Payer: Medicare Other | Admitting: Psychology

## 2017-06-18 DIAGNOSIS — F330 Major depressive disorder, recurrent, mild: Secondary | ICD-10-CM | POA: Diagnosis not present

## 2017-06-19 ENCOUNTER — Encounter
Admission: RE | Disposition: A | Discharge status: Home / Self Care | Payer: Self-pay | Source: Ambulatory Visit | Attending: Ophthalmology

## 2017-06-19 ENCOUNTER — Ambulatory Visit: Payer: Medicare Other | Admitting: Anesthesiology

## 2017-06-19 ENCOUNTER — Ambulatory Visit
Admission: RE | Admit: 2017-06-19 | Discharge: 2017-06-19 | Disposition: A | Discharge status: Home / Self Care | Payer: Medicare Other | Source: Ambulatory Visit | Attending: Ophthalmology | Admitting: Ophthalmology

## 2017-06-19 ENCOUNTER — Encounter: Payer: Self-pay | Admitting: Anesthesiology

## 2017-06-19 DIAGNOSIS — J45909 Unspecified asthma, uncomplicated: Secondary | ICD-10-CM | POA: Diagnosis not present

## 2017-06-19 DIAGNOSIS — F329 Major depressive disorder, single episode, unspecified: Secondary | ICD-10-CM | POA: Diagnosis not present

## 2017-06-19 DIAGNOSIS — K5190 Ulcerative colitis, unspecified, without complications: Secondary | ICD-10-CM | POA: Diagnosis not present

## 2017-06-19 DIAGNOSIS — Z8711 Personal history of peptic ulcer disease: Secondary | ICD-10-CM | POA: Insufficient documentation

## 2017-06-19 DIAGNOSIS — Z96651 Presence of right artificial knee joint: Secondary | ICD-10-CM | POA: Insufficient documentation

## 2017-06-19 DIAGNOSIS — F419 Anxiety disorder, unspecified: Secondary | ICD-10-CM | POA: Diagnosis not present

## 2017-06-19 DIAGNOSIS — H2511 Age-related nuclear cataract, right eye: Secondary | ICD-10-CM | POA: Diagnosis not present

## 2017-06-19 DIAGNOSIS — I739 Peripheral vascular disease, unspecified: Secondary | ICD-10-CM | POA: Insufficient documentation

## 2017-06-19 DIAGNOSIS — G4700 Insomnia, unspecified: Secondary | ICD-10-CM | POA: Diagnosis not present

## 2017-06-19 DIAGNOSIS — Z79899 Other long term (current) drug therapy: Secondary | ICD-10-CM | POA: Diagnosis not present

## 2017-06-19 DIAGNOSIS — I10 Essential (primary) hypertension: Secondary | ICD-10-CM | POA: Diagnosis not present

## 2017-06-19 HISTORY — PX: CATARACT EXTRACTION W/PHACO: SHX586

## 2017-06-19 SURGERY — PHACOEMULSIFICATION, CATARACT, WITH IOL INSERTION
Anesthesia: Monitor Anesthesia Care | Site: Eye | Laterality: Right | Wound class: Clean

## 2017-06-19 MED ORDER — MIDAZOLAM HCL 2 MG/2ML IJ SOLN
INTRAMUSCULAR | Status: DC | PRN
Start: 2017-06-19 — End: 2017-06-19
  Administered 2017-06-19 (×2): 1 mg via INTRAVENOUS

## 2017-06-19 MED ORDER — MIDAZOLAM HCL 2 MG/2ML IJ SOLN
INTRAMUSCULAR | Status: AC
  Filled 2017-06-19: qty 2

## 2017-06-19 MED ORDER — CARBACHOL 0.01 % IO SOLN
INTRAOCULAR | Status: DC | PRN
  Administered 2017-06-19: 0.5 mL via INTRAOCULAR

## 2017-06-19 MED ORDER — POVIDONE-IODINE 5 % OP SOLN
OPHTHALMIC | Status: AC
  Filled 2017-06-19: qty 30

## 2017-06-19 MED ORDER — MOXIFLOXACIN HCL 0.5 % OP SOLN
OPHTHALMIC | Status: AC
  Filled 2017-06-19: qty 3

## 2017-06-19 MED ORDER — ARMC OPHTHALMIC DILATING DROPS
OPHTHALMIC | Status: AC
  Administered 2017-06-19: 1 via OPHTHALMIC
  Filled 2017-06-19: qty 0.4

## 2017-06-19 MED ORDER — EPINEPHRINE PF 1 MG/ML IJ SOLN
INTRAMUSCULAR | Status: DC | PRN
  Administered 2017-06-19: 08:00:00 via OPHTHALMIC

## 2017-06-19 MED ORDER — MOXIFLOXACIN HCL 0.5 % OP SOLN
OPHTHALMIC | Status: DC | PRN
  Administered 2017-06-19: 0.2 mL via OPHTHALMIC

## 2017-06-19 MED ORDER — SODIUM HYALURONATE 23 MG/ML IO SOLN
INTRAOCULAR | Status: DC | PRN
  Administered 2017-06-19: 0.6 mL via INTRAOCULAR

## 2017-06-19 MED ORDER — MOXIFLOXACIN HCL 0.5 % OP SOLN: 1.0000 [drp] | OPHTHALMIC | Status: DC | PRN

## 2017-06-19 MED ORDER — EPINEPHRINE PF 1 MG/ML IJ SOLN
INTRAMUSCULAR | Status: AC
Start: 2017-06-19 — End: 2017-06-19
  Filled 2017-06-19: qty 1

## 2017-06-19 MED ORDER — SODIUM HYALURONATE 10 MG/ML IO SOLN
INTRAOCULAR | Status: DC | PRN
  Administered 2017-06-19: 0.55 mL via INTRAOCULAR

## 2017-06-19 MED ORDER — FENTANYL CITRATE (PF) 100 MCG/2ML IJ SOLN
INTRAMUSCULAR | Status: AC
  Filled 2017-06-19: qty 2

## 2017-06-19 MED ORDER — FENTANYL CITRATE (PF) 100 MCG/2ML IJ SOLN
INTRAMUSCULAR | Status: DC | PRN
  Administered 2017-06-19: 50 ug via INTRAVENOUS

## 2017-06-19 MED ORDER — SODIUM HYALURONATE 23 MG/ML IO SOLN
INTRAOCULAR | Status: AC
  Filled 2017-06-19: qty 0.6

## 2017-06-19 MED ORDER — ARMC OPHTHALMIC DILATING DROPS
1.0000 "application " | OPHTHALMIC | Status: AC
  Administered 2017-06-19 (×3): 1 via OPHTHALMIC

## 2017-06-19 MED ORDER — POVIDONE-IODINE 5 % OP SOLN
OPHTHALMIC | Status: DC | PRN
  Administered 2017-06-19: 1 via OPHTHALMIC

## 2017-06-19 MED ORDER — LIDOCAINE HCL (PF) 4 % IJ SOLN
INTRAMUSCULAR | Status: AC
  Filled 2017-06-19: qty 5

## 2017-06-19 MED ORDER — LIDOCAINE HCL (PF) 4 % IJ SOLN
INTRAOCULAR | Status: DC | PRN
  Administered 2017-06-19: 4 mL via OPHTHALMIC

## 2017-06-19 MED ORDER — SODIUM CHLORIDE 0.9 % IV SOLN
INTRAVENOUS | Status: DC
  Administered 2017-06-19: 06:00:00 via INTRAVENOUS

## 2017-06-19 SURGICAL SUPPLY — 16 items
DISSECTOR HYDRO NUCLEUS 50X22 (MISCELLANEOUS) ×3 IMPLANT
GLOVE BIO SURGEON STRL SZ8 (GLOVE) ×3 IMPLANT
GLOVE BIOGEL M 6.5 STRL (GLOVE) ×3 IMPLANT
GLOVE SURG LX 7.5 STRW (GLOVE) ×2
GLOVE SURG LX STRL 7.5 STRW (GLOVE) ×1 IMPLANT
GOWN STRL REUS W/ TWL LRG LVL3 (GOWN DISPOSABLE) ×2 IMPLANT
GOWN STRL REUS W/TWL LRG LVL3 (GOWN DISPOSABLE) ×4
LABEL CATARACT MEDS ST (LABEL) ×3 IMPLANT
LENS IOL TECNIS ITEC 22.0 (Intraocular Lens) ×3 IMPLANT
PACK CATARACT (MISCELLANEOUS) ×3 IMPLANT
PACK CATARACT KING (MISCELLANEOUS) ×3 IMPLANT
PACK EYE AFTER SURG (MISCELLANEOUS) ×3 IMPLANT
SOL BSS BAG (MISCELLANEOUS) ×3
SOLUTION BSS BAG (MISCELLANEOUS) ×1 IMPLANT
WATER STERILE IRR 250ML POUR (IV SOLUTION) ×3 IMPLANT
WIPE NON LINTING 3.25X3.25 (MISCELLANEOUS) ×3 IMPLANT

## 2017-06-19 NOTE — H&P (Signed)
The History and Physical notes are on paper, have been signed, and are to be scanned.   I have examined the patient and there are no changes to the H&P.   Benay Pillow 06/19/2017 7:24 AM

## 2017-06-19 NOTE — Anesthesia Preprocedure Evaluation (Signed)
Anesthesia Evaluation  Patient identified by MRN, date of birth, ID band Patient awake    Reviewed: Allergy & Precautions, H&P , NPO status , Patient's Chart, lab work & pertinent test results  History of Anesthesia Complications Negative for: history of anesthetic complications  Airway Mallampati: III  TM Distance: <3 FB Neck ROM: limited    Dental  (+) Chipped   Pulmonary neg shortness of breath, asthma ,           Cardiovascular Exercise Tolerance: Good hypertension, (-) angina+ Peripheral Vascular Disease  (-) Past MI      Neuro/Psych PSYCHIATRIC DISORDERS Anxiety Depression negative neurological ROS     GI/Hepatic Neg liver ROS, PUD,   Endo/Other  negative endocrine ROS  Renal/GU      Musculoskeletal   Abdominal   Peds  Hematology negative hematology ROS (+)   Anesthesia Other Findings Past Medical History: No date: Allergy No date: Anxiety No date: Asthma No date: Chicken pox No date: Depression No date: Hypertension No date: Insomnia No date: Ulcerative colitis [facility])  Past Surgical History: 2000: ABDOMINAL HYSTERECTOMY 02/13/2016: COLONOSCOPY WITH PROPOFOL; N/A     Comment:  Procedure: COLONOSCOPY WITH PROPOFOL;  Surgeon: Lollie Sails, MD;  Location: [facility] ENDOSCOPY;  Service:               Endoscopy;  Laterality: N/A; 05/31/2016: COLONOSCOPY WITH PROPOFOL; N/A     Comment:  Procedure: COLONOSCOPY WITH PROPOFOL;  Surgeon: Lollie Sails, MD;  Location: [facility] ENDOSCOPY;  Service:               Endoscopy;  Laterality: N/A; 2013: JOINT REPLACEMENT; Right No date: REPLACEMENT UNICONDYLAR JOINT KNEE  BMI    Body Mass Index:  22.86 kg/m      Reproductive/Obstetrics negative OB ROS                             Anesthesia Physical Anesthesia Plan  ASA: III  Anesthesia Plan: MAC   Post-op Pain Management:    Induction:  Intravenous  PONV Risk Score and Plan:   Airway Management Planned: Natural Airway and Nasal Cannula  Additional Equipment:   Intra-op Plan:   Post-operative Plan:   Informed Consent: I have reviewed the patients History and Physical, chart, labs and discussed the procedure including the risks, benefits and alternatives for the proposed anesthesia with the patient or authorized representative who has indicated his/her understanding and acceptance.   Dental Advisory Given  Plan Discussed with: Anesthesiologist, CRNA and Surgeon  Anesthesia Plan Comments: (Patient consented for risks of anesthesia including but not limited to:  - adverse reactions to medications - damage to teeth, lips or other oral mucosa - sore throat or hoarseness - Damage to heart, brain, lungs or loss of life  Patient voiced understanding.)        Anesthesia Quick Evaluation

## 2017-06-19 NOTE — Anesthesia Procedure Notes (Signed)
Performed by: Cook-Martin, Shomari Scicchitano Pre-anesthesia Checklist: Patient identified, Emergency Drugs available, Suction available, Patient being monitored and Timeout performed Patient Re-evaluated:Patient Re-evaluated prior to induction Oxygen Delivery Method: Nasal cannula Preoxygenation: Pre-oxygenation with 100% oxygen Induction Type: IV induction Placement Confirmation: positive ETCO2 and CO2 detector       

## 2017-06-19 NOTE — Anesthesia Postprocedure Evaluation (Signed)
Anesthesia Post Note  Patient: Katelyn Dillon  Procedure(s) Performed: CATARACT EXTRACTION PHACO AND INTRAOCULAR LENS PLACEMENT (IOC)-RIGHT (Right Eye)  Patient location during evaluation: PACU Anesthesia Type: MAC Level of consciousness: awake and alert Pain management: pain level controlled Vital Signs Assessment: post-procedure vital signs reviewed and stable Respiratory status: spontaneous breathing, nonlabored ventilation, respiratory function stable and patient connected to nasal cannula oxygen Cardiovascular status: stable and blood pressure returned to baseline Postop Assessment: no apparent nausea or vomiting Anesthetic complications: no     Last Vitals:  Vitals:   06/19/17 0808 06/19/17 0819  BP: 128/77 128/67  Pulse: 65 63  Resp: 16 16  Temp: 36.9 C   SpO2: 98% 100%    Last Pain:  Vitals:   06/19/17 1460  TempSrc: Oral                 Precious Haws [name]

## 2017-06-19 NOTE — Anesthesia Post-op Follow-up Note (Signed)
Anesthesia QCDR form completed.        

## 2017-06-19 NOTE — Transfer of Care (Signed)
Immediate Anesthesia Transfer of Care Note  Patient: Katelyn Dillon  Procedure(s) Performed: CATARACT EXTRACTION PHACO AND INTRAOCULAR LENS PLACEMENT (IOC)-RIGHT (Right Eye)  Patient Location: Short Stay  Anesthesia Type:MAC  Level of Consciousness: awake, alert  and oriented  Airway & Oxygen Therapy: Patient Spontanous Breathing and Patient connected to nasal cannula oxygen  Post-op Assessment: Report given to RN and Post -op Vital signs reviewed and stable  Post vital signs: Reviewed and stable  Last Vitals:  Vitals:   06/19/17 0604  BP: 133/70  Pulse: 63  Resp: 17  Temp: 36.8 C  SpO2: 98%    Last Pain:  Vitals:   06/19/17 0604  TempSrc: Oral         Complications: No apparent anesthesia complications

## 2017-06-19 NOTE — Op Note (Signed)
OPERATIVE NOTE  Katelyn Dillon [id number] 06/19/2017   PREOPERATIVE DIAGNOSIS:  Nuclear sclerotic cataract right eye.  H25.11   POSTOPERATIVE DIAGNOSIS:    Nuclear sclerotic cataract right eye.     PROCEDURE:  Phacoemusification with posterior chamber intraocular lens placement of the right eye   LENS:   Implant Name Type Inv. Item Serial No. Manufacturer Lot No. LRB No. Used  LENS IOL DIOP 22.0 - [id number] Intraocular Lens LENS IOL DIOP 22.0 [id number]  Right 1       PCB00 +22.0   ULTRASOUND TIME: 0 minutes 31.4 seconds.  CDE 3.66   SURGEON:  Benay Pillow, MD, MPH  ANESTHESIOLOGIST: Anesthesiologist: Piscitello, Precious Haws, MD CRNA: Vaughan Sine   ANESTHESIA:  Topical with tetracaine drops augmented with 1% preservative-free intracameral lidocaine.  ESTIMATED BLOOD LOSS: less than 1 mL.   COMPLICATIONS:  None.   DESCRIPTION OF PROCEDURE:  The patient was identified in the holding room and transported to the operating room and placed in the supine position under the operating microscope.  The right eye was identified as the operative eye and it was prepped and draped in the usual sterile ophthalmic fashion.   A 1.0 millimeter clear-corneal paracentesis was made at the 10:30 position. 0.5 ml of preservative-free 1% lidocaine with epinephrine was injected into the anterior chamber.  The anterior chamber was filled with Healon 5 viscoelastic.  A 2.4 millimeter keratome was used to make a near-clear corneal incision at the 8:00 position.  A curvilinear capsulorrhexis was made with a cystotome and capsulorrhexis forceps.  Balanced salt solution was used to hydrodissect and hydrodelineate the nucleus.   Phacoemulsification was then used in stop and chop fashion to remove the lens nucleus and epinucleus.  The remaining cortex was then removed using the irrigation and aspiration handpiece. Healon was then placed into the capsular bag to distend it for lens placement.  A lens  was then injected into the capsular bag.  The remaining viscoelastic was aspirated.   Wounds were hydrated with balanced salt solution.  The anterior chamber was inflated to a physiologic pressure with balanced salt solution.   Intracameral vigamox 0.1 mL undiluted was injected into the eye and a drop placed onto the ocular surface.  No wound leaks were noted.  The patient was taken to the recovery room in stable condition without complications of anesthesia or surgery  Benay Pillow 06/19/2017, 8:01 AM

## 2017-06-19 NOTE — Discharge Instructions (Signed)
Eye Surgery Discharge Instructions  Expect mild scratchy sensation or mild soreness. DO NOT RUB YOUR EYE!  The day of surgery:  Minimal physical activity, but bed rest is not required  No reading, computer work, or close hand work  No bending, lifting, or straining.  May watch TV  For 24 hours:  No driving, legal decisions, or alcoholic beverages  Safety precautions  Eat anything you prefer: It is better to start with liquids, then soup then solid foods.  _____ Eye patch should be worn until postoperative exam tomorrow.  ____ Solar shield eyeglasses should be worn for comfort in the sunlight/patch while sleeping  Resume all regular medications including aspirin or Coumadin if these were discontinued prior to surgery. You may shower, bathe, shave, or wash your hair. Tylenol may be taken for mild discomfort.  Call your doctor if you experience significant pain, nausea, or vomiting, fever > 101 or other signs of infection. [phone] or [phone] Specific instructions:  Follow-up Information    Eulogio Bear, MD Follow up on 06/20/2017.   Specialty:  Ophthalmology Why:  10:05 Contact information: [place] [id number] [phone]

## 2017-06-27 ENCOUNTER — Other Ambulatory Visit: Payer: Self-pay | Admitting: Internal Medicine

## 2017-06-27 DIAGNOSIS — F32A Depression, unspecified: Secondary | ICD-10-CM

## 2017-06-27 DIAGNOSIS — F329 Major depressive disorder, single episode, unspecified: Secondary | ICD-10-CM

## 2017-06-27 DIAGNOSIS — F419 Anxiety disorder, unspecified: Principal | ICD-10-CM

## 2017-06-27 NOTE — Telephone Encounter (Signed)
Last filled by [facility] 05/2017, please advise if ok to refill

## 2017-06-28 NOTE — Telephone Encounter (Signed)
Refills sent to pharmacy. 

## 2017-06-28 NOTE — Progress Notes (Signed)
[place]  [phone] Fax:(336) [phone]  ID: Katelyn Dillon OB: July 12, 1940  MR#: [id number]  KGU#:[id number]  Patient Care Team: Pleas Koch, NP as PCP - General (Nurse Practitioner)  CHIEF COMPLAINT: Thrombocytosis.  INTERVAL HISTORY: Patient returns to clinic today for repeat laboratory work and further evaluation.  She continues to feel well and is asymptomatic.  Her colitis continues to be well controlled now that she is taking 6-MP. She has no neurologic complaints. She denies any recent fevers or illnesses. She has a good appetite and denies weight loss. She has no chest pain or shortness of breath. She denies any nausea, vomiting, constipation, or diarrhea. She has no melena or hematochezia. She has no urinary complaints. Patient feels at her baseline and offers no specific complaints today.  REVIEW OF SYSTEMS:   Review of Systems  Constitutional: Negative.  Negative for fever, malaise/fatigue and weight loss.  HENT: Negative.   Respiratory: Negative.  Negative for cough.   Cardiovascular: Negative.  Negative for chest pain.  Gastrointestinal: Negative.  Negative for abdominal pain, blood in stool and melena.  Genitourinary: Negative.   Musculoskeletal: Negative.   Skin: Negative.  Negative for rash.  Neurological: Negative.  Negative for weakness.  Psychiatric/Behavioral: Negative.  The patient is not nervous/anxious.     As per HPI. Otherwise, a complete review of systems is negative.  PAST MEDICAL HISTORY: Past Medical History:  Diagnosis Date  . Allergy   . Anxiety   . Asthma   . Chicken pox   . Depression   . Hypertension   . Insomnia   . Ulcerative colitis ([place])     PAST SURGICAL HISTORY: Past Surgical History:  Procedure Laterality Date  . ABDOMINAL HYSTERECTOMY  2000  . CATARACT EXTRACTION W/PHACO Right 06/19/2017   Procedure: CATARACT EXTRACTION PHACO AND INTRAOCULAR LENS PLACEMENT (IOC)-RIGHT;  Surgeon: Eulogio Bear, MD;  Location: [facility] ORS;  Service: Ophthalmology;  Laterality: Right;  [place] 00:31.4 AP% 11.6 CDE 3.66 Fluid Pack lot # [id number]  . COLONOSCOPY WITH PROPOFOL N/A 02/13/2016   Procedure: COLONOSCOPY WITH PROPOFOL;  Surgeon: Lollie Sails, MD;  Location: [facility] ENDOSCOPY;  Service: Endoscopy;  Laterality: N/A;  . COLONOSCOPY WITH PROPOFOL N/A 05/31/2016   Procedure: COLONOSCOPY WITH PROPOFOL;  Surgeon: Lollie Sails, MD;  Location: [facility] ENDOSCOPY;  Service: Endoscopy;  Laterality: N/A;  . JOINT REPLACEMENT Right 2013  . REPLACEMENT UNICONDYLAR JOINT KNEE      FAMILY HISTORY: Family History  Problem Relation Age of Onset  . Hypertension Mother   . Hypertension Father     ADVANCED DIRECTIVES (Y/N):  N  HEALTH MAINTENANCE: Social History   Tobacco Use  . Smoking status: Never Smoker  . Smokeless tobacco: Never Used  Substance Use Topics  . Alcohol use: Yes    Alcohol/week: 0.0 oz    Comment: social  . Drug use: No     Colonoscopy:  PAP:  Bone density:  Lipid panel:  No Known Allergies  Current Outpatient Medications  Medication Sig Dispense Refill  . albuterol (PROVENTIL HFA;VENTOLIN HFA) 108 (90 Base) MCG/ACT inhaler Inhale 2 puffs into the lungs every 4 (four) hours as needed for wheezing or shortness of breath. 1 Inhaler 5  . amLODipine-benazepril (LOTREL) 10-20 MG capsule take 1 capsule by mouth daily 90 capsule 1  . busPIRone (BUSPAR) 7.5 MG tablet take 1 tablet by mouth twice a day 180 tablet 1  . fluticasone (FLOVENT HFA) 44 MCG/ACT inhaler INHALE 2 PUFFS INTO  LUNGS TWO TIMES A DAY 10.6 g 11  . PARoxetine (PAXIL) 40 MG tablet Take 0.5 tablets (20 mg total) by mouth daily. 45 tablet 1  . tiZANidine (ZANAFLEX) 4 MG tablet take 1 tablet by mouth every 6 hours if needed for muscle spasm 90 tablet 1   No current facility-administered medications for this visit.     OBJECTIVE: Vitals:   06/30/17 1045  BP: (!) 141/87  Pulse: 79  Resp: 18  Temp:  99.6 F (37.6 C)     Body mass index is 24.11 kg/m.    ECOG FS:0 - Asymptomatic  General: Well-developed, well-nourished, no acute distress. Eyes: Pink conjunctiva, anicteric sclera. Lungs: Clear to auscultation bilaterally. Heart: Regular rate and rhythm. No rubs, murmurs, or gallops. Abdomen: Soft, nontender, nondistended. No organomegaly noted, normoactive bowel sounds. Musculoskeletal: No edema, cyanosis, or clubbing. Neuro: Alert, answering all questions appropriately. Cranial nerves grossly intact. Skin: No rashes or petechiae noted. Psych: Normal affect.   LAB RESULTS:  Lab Results  Component Value Date   NA 140 11/23/2015   K 3.9 11/23/2015   CL 104 11/23/2015   CO2 28 11/23/2015   GLUCOSE 97 11/23/2015   BUN 18 11/23/2015   CREATININE 0.86 11/23/2015   CALCIUM 10.2 11/23/2015   PROT 7.0 02/06/2015   ALBUMIN 4.2 02/06/2015   AST 16 02/06/2015   ALT 11 02/06/2015   ALKPHOS 117 02/06/2015   BILITOT 0.8 02/06/2015    Lab Results  Component Value Date   WBC 7.5 06/30/2017   NEUTROABS 5.3 06/30/2017   HGB 13.3 06/30/2017   HCT 40.2 06/30/2017   MCV 93.1 06/30/2017   PLT 363 06/30/2017     STUDIES: No results found.  ASSESSMENT: Thrombocytosis.  PLAN:    1. Thrombocytosis: Patient platelet count is within normal limits today.  Previously all of her blood work including iron stores, JAK-2 mutation, and peripheral blood flow cytometry are either negative or within normal limits.  It is likely her transient elevation of platelets was reactive secondary to ongoing inflammation from her underlying colitis.  No intervention is needed.  She does not require bone marrow biopsy.  After discussion with the patient, it was agreed upon that no further follow-up is necessary.  Please refer her back if there are any questions or concerns.  2. Hypertension: Patient's blood pressure is mildly elevated today. Continue monitoring and treatment per primary care.  Approximately  20 minutes was spent in discussion of which greater than 50% was consultation.  Patient expressed understanding and was in agreement with this plan. She also understands that She can call clinic at any time with any questions, concerns, or complaints.    Lloyd Huger, MD   06/30/2017 1:32 PM

## 2017-06-30 ENCOUNTER — Inpatient Hospital Stay (HOSPITAL_BASED_OUTPATIENT_CLINIC_OR_DEPARTMENT_OTHER): Payer: Medicare Other | Admitting: Oncology

## 2017-06-30 ENCOUNTER — Inpatient Hospital Stay: Payer: Medicare Other | Attending: Oncology

## 2017-06-30 VITALS — BP 141/87 | HR 79 | Temp 99.6°F | Resp 18 | Wt 131.8 lb

## 2017-06-30 DIAGNOSIS — K529 Noninfective gastroenteritis and colitis, unspecified: Secondary | ICD-10-CM | POA: Diagnosis not present

## 2017-06-30 DIAGNOSIS — F329 Major depressive disorder, single episode, unspecified: Secondary | ICD-10-CM

## 2017-06-30 DIAGNOSIS — G4700 Insomnia, unspecified: Secondary | ICD-10-CM

## 2017-06-30 DIAGNOSIS — Z90710 Acquired absence of both cervix and uterus: Secondary | ICD-10-CM | POA: Diagnosis not present

## 2017-06-30 DIAGNOSIS — F419 Anxiety disorder, unspecified: Secondary | ICD-10-CM | POA: Diagnosis not present

## 2017-06-30 DIAGNOSIS — K5190 Ulcerative colitis, unspecified, without complications: Secondary | ICD-10-CM | POA: Insufficient documentation

## 2017-06-30 DIAGNOSIS — I10 Essential (primary) hypertension: Secondary | ICD-10-CM

## 2017-06-30 DIAGNOSIS — D473 Essential (hemorrhagic) thrombocythemia: Secondary | ICD-10-CM

## 2017-06-30 DIAGNOSIS — J45909 Unspecified asthma, uncomplicated: Secondary | ICD-10-CM | POA: Insufficient documentation

## 2017-06-30 DIAGNOSIS — Z79899 Other long term (current) drug therapy: Secondary | ICD-10-CM | POA: Insufficient documentation

## 2017-06-30 DIAGNOSIS — D75839 Thrombocytosis, unspecified: Secondary | ICD-10-CM

## 2017-06-30 LAB — CBC WITH DIFFERENTIAL/PLATELET
BASOS ABS: 0.1 10*3/uL (ref 0–0.1)
BASOS PCT: 1 %
EOS PCT: 3 %
Eosinophils Absolute: 0.2 10*3/uL (ref 0–0.7)
HCT: 40.2 % (ref 35.0–47.0)
Hemoglobin: 13.3 g/dL (ref 12.0–16.0)
Lymphocytes Relative: 18 %
Lymphs Abs: 1.3 10*3/uL (ref 1.0–3.6)
MCH: 30.7 pg (ref 26.0–34.0)
MCHC: 33 g/dL (ref 32.0–36.0)
MCV: 93.1 fL (ref 80.0–100.0)
MONO ABS: 0.6 10*3/uL (ref 0.2–0.9)
Monocytes Relative: 8 %
NEUTROS ABS: 5.3 10*3/uL (ref 1.4–6.5)
Neutrophils Relative %: 70 %
PLATELETS: 363 10*3/uL (ref 150–440)
RBC: 4.32 MIL/uL (ref 3.80–5.20)
RDW: 15.8 % — AB (ref 11.5–14.5)
WBC: 7.5 10*3/uL (ref 3.6–11.0)

## 2017-07-09 DIAGNOSIS — H2512 Age-related nuclear cataract, left eye: Secondary | ICD-10-CM | POA: Diagnosis not present

## 2017-07-14 ENCOUNTER — Ambulatory Visit (INDEPENDENT_AMBULATORY_CARE_PROVIDER_SITE_OTHER): Payer: Medicare Other | Admitting: Psychology

## 2017-07-14 DIAGNOSIS — F330 Major depressive disorder, recurrent, mild: Secondary | ICD-10-CM

## 2017-07-17 ENCOUNTER — Ambulatory Visit
Admission: RE | Admit: 2017-07-17 | Discharge: 2017-07-17 | Disposition: A | Discharge status: Home / Self Care | Payer: Medicare Other | Source: Ambulatory Visit | Attending: Ophthalmology | Admitting: Ophthalmology

## 2017-07-17 ENCOUNTER — Encounter
Admission: RE | Disposition: A | Discharge status: Home / Self Care | Payer: Self-pay | Source: Ambulatory Visit | Attending: Ophthalmology

## 2017-07-17 ENCOUNTER — Ambulatory Visit: Payer: Medicare Other | Admitting: Anesthesiology

## 2017-07-17 ENCOUNTER — Encounter: Payer: Self-pay | Admitting: Anesthesiology

## 2017-07-17 DIAGNOSIS — K5190 Ulcerative colitis, unspecified, without complications: Secondary | ICD-10-CM | POA: Insufficient documentation

## 2017-07-17 DIAGNOSIS — H2512 Age-related nuclear cataract, left eye: Secondary | ICD-10-CM | POA: Insufficient documentation

## 2017-07-17 DIAGNOSIS — F419 Anxiety disorder, unspecified: Secondary | ICD-10-CM | POA: Diagnosis not present

## 2017-07-17 DIAGNOSIS — J45909 Unspecified asthma, uncomplicated: Secondary | ICD-10-CM | POA: Diagnosis not present

## 2017-07-17 DIAGNOSIS — F329 Major depressive disorder, single episode, unspecified: Secondary | ICD-10-CM | POA: Insufficient documentation

## 2017-07-17 DIAGNOSIS — I739 Peripheral vascular disease, unspecified: Secondary | ICD-10-CM | POA: Insufficient documentation

## 2017-07-17 DIAGNOSIS — I10 Essential (primary) hypertension: Secondary | ICD-10-CM | POA: Diagnosis not present

## 2017-07-17 DIAGNOSIS — F418 Other specified anxiety disorders: Secondary | ICD-10-CM | POA: Diagnosis not present

## 2017-07-17 DIAGNOSIS — Z79899 Other long term (current) drug therapy: Secondary | ICD-10-CM | POA: Insufficient documentation

## 2017-07-17 HISTORY — PX: CATARACT EXTRACTION W/PHACO: SHX586

## 2017-07-17 SURGERY — PHACOEMULSIFICATION, CATARACT, WITH IOL INSERTION
Anesthesia: Monitor Anesthesia Care | Site: Eye | Laterality: Left | Wound class: Clean

## 2017-07-17 MED ORDER — MIDAZOLAM HCL 2 MG/2ML IJ SOLN
INTRAMUSCULAR | Status: AC
  Filled 2017-07-17: qty 2

## 2017-07-17 MED ORDER — MOXIFLOXACIN HCL 0.5 % OP SOLN
OPHTHALMIC | Status: DC | PRN
  Administered 2017-07-17: 0.2 mL via OPHTHALMIC

## 2017-07-17 MED ORDER — MIDAZOLAM HCL 2 MG/2ML IJ SOLN
INTRAMUSCULAR | Status: DC | PRN
  Administered 2017-07-17 (×2): 1 mg via INTRAVENOUS

## 2017-07-17 MED ORDER — POVIDONE-IODINE 5 % OP SOLN
OPHTHALMIC | Status: AC
  Filled 2017-07-17: qty 30

## 2017-07-17 MED ORDER — LIDOCAINE HCL (PF) 4 % IJ SOLN
INTRAMUSCULAR | Status: AC
  Filled 2017-07-17: qty 5

## 2017-07-17 MED ORDER — FENTANYL CITRATE (PF) 100 MCG/2ML IJ SOLN
INTRAMUSCULAR | Status: DC | PRN
  Administered 2017-07-17: 50 ug via INTRAVENOUS
  Administered 2017-07-17 (×2): 25 ug via INTRAVENOUS

## 2017-07-17 MED ORDER — SODIUM HYALURONATE 23 MG/ML IO SOLN
INTRAOCULAR | Status: DC | PRN
  Administered 2017-07-17: 0.6 mL via INTRAOCULAR

## 2017-07-17 MED ORDER — ARMC OPHTHALMIC DILATING DROPS
1.0000 "application " | OPHTHALMIC | Status: DC
  Administered 2017-07-17 (×3): 1 via OPHTHALMIC

## 2017-07-17 MED ORDER — LIDOCAINE HCL (PF) 4 % IJ SOLN
INTRAOCULAR | Status: DC | PRN
  Administered 2017-07-17: 4 mL via OPHTHALMIC

## 2017-07-17 MED ORDER — SODIUM HYALURONATE 23 MG/ML IO SOLN
INTRAOCULAR | Status: AC
Start: 2017-07-17 — End: 2017-07-17
  Filled 2017-07-17: qty 0.6

## 2017-07-17 MED ORDER — MOXIFLOXACIN HCL 0.5 % OP SOLN
OPHTHALMIC | Status: AC
  Filled 2017-07-17: qty 3

## 2017-07-17 MED ORDER — SODIUM CHLORIDE 0.9 % IV SOLN
INTRAVENOUS | Status: DC
  Administered 2017-07-17: 10:00:00 via INTRAVENOUS

## 2017-07-17 MED ORDER — CARBACHOL 0.01 % IO SOLN
INTRAOCULAR | Status: DC | PRN
  Administered 2017-07-17: 0.5 mL via INTRAOCULAR

## 2017-07-17 MED ORDER — EPINEPHRINE PF 1 MG/ML IJ SOLN
INTRAMUSCULAR | Status: AC
  Filled 2017-07-17: qty 2

## 2017-07-17 MED ORDER — EPINEPHRINE PF 1 MG/ML IJ SOLN
INTRAOCULAR | Status: DC | PRN
  Administered 2017-07-17: 11:00:00 via OPHTHALMIC

## 2017-07-17 MED ORDER — SODIUM HYALURONATE 10 MG/ML IO SOLN
INTRAOCULAR | Status: DC | PRN
  Administered 2017-07-17: 0.55 mL via INTRAOCULAR

## 2017-07-17 MED ORDER — MOXIFLOXACIN HCL 0.5 % OP SOLN: 1.0000 [drp] | OPHTHALMIC | Status: DC | PRN

## 2017-07-17 MED ORDER — POVIDONE-IODINE 5 % OP SOLN
OPHTHALMIC | Status: DC | PRN
  Administered 2017-07-17: 1

## 2017-07-17 MED ORDER — FENTANYL CITRATE (PF) 100 MCG/2ML IJ SOLN
INTRAMUSCULAR | Status: AC
  Filled 2017-07-17: qty 2

## 2017-07-17 MED ORDER — ARMC OPHTHALMIC DILATING DROPS
OPHTHALMIC | Status: AC
  Administered 2017-07-17: 1 via OPHTHALMIC
  Filled 2017-07-17: qty 0.4

## 2017-07-17 SURGICAL SUPPLY — 16 items
DISSECTOR HYDRO NUCLEUS 50X22 (MISCELLANEOUS) ×3 IMPLANT
GLOVE BIO SURGEON STRL SZ8 (GLOVE) ×3 IMPLANT
GLOVE BIOGEL M 6.5 STRL (GLOVE) ×3 IMPLANT
GLOVE SURG LX 7.5 STRW (GLOVE) ×2
GLOVE SURG LX STRL 7.5 STRW (GLOVE) ×1 IMPLANT
GOWN STRL REUS W/ TWL LRG LVL3 (GOWN DISPOSABLE) ×2 IMPLANT
GOWN STRL REUS W/TWL LRG LVL3 (GOWN DISPOSABLE) ×4
LABEL CATARACT MEDS ST (LABEL) ×3 IMPLANT
LENS IOL TECNIS ITEC 22.0 (Intraocular Lens) ×3 IMPLANT
PACK CATARACT (MISCELLANEOUS) ×3 IMPLANT
PACK CATARACT KING (MISCELLANEOUS) ×3 IMPLANT
PACK EYE AFTER SURG (MISCELLANEOUS) ×3 IMPLANT
SOL BSS BAG (MISCELLANEOUS) ×3
SOLUTION BSS BAG (MISCELLANEOUS) ×1 IMPLANT
WATER STERILE IRR 250ML POUR (IV SOLUTION) ×3 IMPLANT
WIPE NON LINTING 3.25X3.25 (MISCELLANEOUS) ×3 IMPLANT

## 2017-07-17 NOTE — Anesthesia Preprocedure Evaluation (Signed)
Anesthesia Evaluation  Patient identified by MRN, date of birth, ID band Patient awake    Reviewed: Allergy & Precautions, NPO status , Patient's Chart, lab work & pertinent test results, reviewed documented beta blocker date and time   Airway Mallampati: III  TM Distance: >3 FB     Dental  (+) Chipped   Pulmonary asthma ,           Cardiovascular hypertension, Pt. on medications + Peripheral Vascular Disease       Neuro/Psych PSYCHIATRIC DISORDERS Anxiety Depression    GI/Hepatic PUD,   Endo/Other    Renal/GU      Musculoskeletal   Abdominal   Peds  Hematology   Anesthesia Other Findings   Reproductive/Obstetrics                             Anesthesia Physical Anesthesia Plan  ASA: III  Anesthesia Plan: MAC   Post-op Pain Management:    Induction:   PONV Risk Score and Plan:   Airway Management Planned:   Additional Equipment:   Intra-op Plan:   Post-operative Plan:   Informed Consent: I have reviewed the patients History and Physical, chart, labs and discussed the procedure including the risks, benefits and alternatives for the proposed anesthesia with the patient or authorized representative who has indicated his/her understanding and acceptance.     Plan Discussed with: CRNA  Anesthesia Plan Comments:         Anesthesia Quick Evaluation

## 2017-07-17 NOTE — Anesthesia Procedure Notes (Signed)
Performed by: Cook-Martin, Satonya Lux Pre-anesthesia Checklist: Patient identified, Emergency Drugs available, Suction available, Patient being monitored and Timeout performed Patient Re-evaluated:Patient Re-evaluated prior to induction Oxygen Delivery Method: Nasal cannula Preoxygenation: Pre-oxygenation with 100% oxygen Induction Type: IV induction Placement Confirmation: positive ETCO2 and CO2 detector       

## 2017-07-17 NOTE — H&P (Signed)
The History and Physical notes are on paper, have been signed, and are to be scanned.   I have examined the patient and there are no changes to the H&P.   Benay Pillow 07/17/2017 10:47 AM

## 2017-07-17 NOTE — Transfer of Care (Signed)
Immediate Anesthesia Transfer of Care Note  Patient: Katelyn Dillon  Procedure(s) Performed: CATARACT EXTRACTION PHACO AND INTRAOCULAR LENS PLACEMENT (IOC) (Left Eye)  Patient Location: PACU and Short Stay  Anesthesia Type:General  Level of Consciousness: awake, alert  and oriented  Airway & Oxygen Therapy: Patient Spontanous Breathing  Post-op Assessment: Report given to RN and Post -op Vital signs reviewed and stable  Post vital signs: Reviewed and stable  Last Vitals:  Vitals:   07/17/17 0934  BP: (!) 153/78  Pulse: 74  Resp: 17  Temp: 36.9 C  SpO2: 100%    Last Pain:  Vitals:   07/17/17 0934  TempSrc: Tympanic         Complications: No apparent anesthesia complications

## 2017-07-17 NOTE — Anesthesia Postprocedure Evaluation (Signed)
Anesthesia Post Note  Patient: Katelyn Dillon  Procedure(s) Performed: CATARACT EXTRACTION PHACO AND INTRAOCULAR LENS PLACEMENT (IOC) (Left Eye)  Patient location during evaluation: PACU Anesthesia Type: MAC Level of consciousness: awake and alert Pain management: pain level controlled Vital Signs Assessment: post-procedure vital signs reviewed and stable Respiratory status: spontaneous breathing, nonlabored ventilation, respiratory function stable and patient connected to nasal cannula oxygen Cardiovascular status: stable and blood pressure returned to baseline Postop Assessment: no apparent nausea or vomiting Anesthetic complications: no     Last Vitals:  Vitals:   07/17/17 0934 07/17/17 1100  BP: (!) 153/78 (!) 155/78  Pulse: 74 80  Resp: 17 17  Temp: 36.9 C 36.9 C  SpO2: 100% 100%    Last Pain:  Vitals:   07/17/17 0934  TempSrc: Tympanic                 Mcihael Hinderman S

## 2017-07-17 NOTE — Discharge Instructions (Addendum)
Eye Surgery Discharge Instructions  Expect mild scratchy sensation or mild soreness. DO NOT RUB YOUR EYE!  The day of surgery:  Minimal physical activity, but bed rest is not required  No reading, computer work, or close hand work  No bending, lifting, or straining.  May watch TV  For 24 hours:  No driving, legal decisions, or alcoholic beverages  Safety precautions  Eat anything you prefer: It is better to start with liquids, then soup then solid foods.  _____ Eye patch should be worn until postoperative exam tomorrow.  ____ Solar shield eyeglasses should be worn for comfort in the sunlight/patch while sleeping  Resume all regular medications including aspirin or Coumadin if these were discontinued prior to surgery. You may shower, bathe, shave, or wash your hair. Tylenol may be taken for mild discomfort.  Call your doctor if you experience significant pain, nausea, or vomiting, fever > 101 or other signs of infection. [phone] or [phone] Specific instructions:  Follow-up Information    Eulogio Bear, MD Follow up.   Specialty:  Ophthalmology Why:  07/18/17 at 9:40 at [facility] information: [place] [address] [phone]        Eulogio Bear, MD Follow up.   Specialty:  Ophthalmology Contact information: [address] [place] [address] [phone]          Eye Surgery Discharge Instructions  Expect mild scratchy sensation or mild soreness. DO NOT RUB YOUR EYE!  The day of surgery:  Minimal physical activity, but bed rest is not required  No reading, computer work, or close hand work  No bending, lifting, or straining.  May watch TV  For 24 hours:  No driving, legal decisions, or alcoholic beverages  Safety precautions  Eat anything you prefer: It is better to start with liquids, then soup then solid foods.  _____ Eye patch should be worn until postoperative exam  tomorrow.  ____ Solar shield eyeglasses should be worn for comfort in the sunlight/patch while sleeping  Resume all regular medications including aspirin or Coumadin if these were discontinued prior to surgery. You may shower, bathe, shave, or wash your hair. Tylenol may be taken for mild discomfort.  Call your doctor if you experience significant pain, nausea, or vomiting, fever > 101 or other signs of infection. [phone] or [phone] Specific instructions:  Follow-up Information    Eulogio Bear, MD Follow up.   Specialty:  Ophthalmology Why:  07/18/17 at 9:40 at [facility] information: [place] [address] [phone]        Eulogio Bear, MD Follow up.   Specialty:  Ophthalmology Contact information: [address] [place] [address] [phone]

## 2017-07-17 NOTE — Anesthesia Post-op Follow-up Note (Signed)
Anesthesia QCDR form completed.        

## 2017-07-17 NOTE — Op Note (Signed)
OPERATIVE NOTE  Katelyn Dillon [id number] 07/17/2017   PREOPERATIVE DIAGNOSIS:  Nuclear sclerotic cataract left eye.  H25.12   POSTOPERATIVE DIAGNOSIS:    Nuclear sclerotic cataract left eye.     PROCEDURE:  Phacoemusification with posterior chamber intraocular lens placement of the left eye   LENS:   Implant Name Type Inv. Item Serial No. Manufacturer Lot No. LRB No. Used  LENS IOL DIOP 22.0 - [id number] Intraocular Lens LENS IOL DIOP 22.0 [id number]  Left 1       PCB00+22.0   ULTRASOUND TIME: 0 minutes 29.1 seconds.  CDE 3.64   SURGEON:  Benay Pillow, MD, MPH   ANESTHESIA:  Topical with tetracaine drops augmented with 1% preservative-free intracameral lidocaine.  ESTIMATED BLOOD LOSS: <1 mL   COMPLICATIONS:  None.   DESCRIPTION OF PROCEDURE:  The patient was identified in the holding room and transported to the operating room and placed in the supine position under the operating microscope.  The left eye was identified as the operative eye and it was prepped and draped in the usual sterile ophthalmic fashion.   A 1.0 millimeter clear-corneal paracentesis was made at the 5:00 position. 0.5 ml of preservative-free 1% lidocaine with epinephrine was injected into the anterior chamber.  The anterior chamber was filled with Healon 5 viscoelastic.  A 2.4 millimeter keratome was used to make a near-clear corneal incision at the 2:00 position.  A curvilinear capsulorrhexis was made with a cystotome and capsulorrhexis forceps.  Balanced salt solution was used to hydrodissect and hydrodelineate the nucleus.   Phacoemulsification was then used in stop and chop fashion to remove the lens nucleus and epinucleus.  The remaining cortex was then removed using the irrigation and aspiration handpiece. Healon was then placed into the capsular bag to distend it for lens placement.  A lens was then injected into the capsular bag.  The remaining viscoelastic was aspirated.   Wounds were hydrated  with balanced salt solution.  The anterior chamber was inflated to a physiologic pressure with balanced salt solution.  Intracameral vigamox 0.1 mL undiltued was injected into the eye and a drop placed onto the ocular surface.  No wound leaks were noted.  The patient was taken to the recovery room in stable condition without complications of anesthesia or surgery  Benay Pillow 07/17/2017, 11:16 AM

## 2017-07-18 ENCOUNTER — Encounter: Payer: Self-pay | Admitting: Ophthalmology

## 2017-07-28 ENCOUNTER — Ambulatory Visit: Payer: Medicare Other | Admitting: Psychology

## 2017-07-29 ENCOUNTER — Ambulatory Visit: Payer: Medicare Other | Admitting: Psychology

## 2017-08-12 ENCOUNTER — Ambulatory Visit (INDEPENDENT_AMBULATORY_CARE_PROVIDER_SITE_OTHER): Payer: Medicare Other | Admitting: Psychology

## 2017-08-12 DIAGNOSIS — F330 Major depressive disorder, recurrent, mild: Secondary | ICD-10-CM

## 2017-08-14 ENCOUNTER — Telehealth: Payer: Self-pay

## 2017-08-14 DIAGNOSIS — I10 Essential (primary) hypertension: Secondary | ICD-10-CM

## 2017-08-14 DIAGNOSIS — M62838 Other muscle spasm: Secondary | ICD-10-CM

## 2017-08-14 MED ORDER — TIZANIDINE HCL 4 MG PO TABS: ORAL_TABLET | ORAL | 0 refills | Status: DC

## 2017-08-14 MED ORDER — AMLODIPINE BESY-BENAZEPRIL HCL 10-20 MG PO CAPS: 1.0000 | ORAL_CAPSULE | Freq: Every day | ORAL | 0 refills | Status: DC

## 2017-08-14 NOTE — Telephone Encounter (Signed)
Pt transferred to pharmacy and old pharmacy has lost scripts for Tizanidine and Amlodipine-Benazepril.

## 2017-08-14 NOTE — Telephone Encounter (Signed)
Please notify patient that Rx's were sent to new pharmacy. She will be due for general follow up in March 2019, please schedule.

## 2017-08-15 NOTE — Telephone Encounter (Signed)
Message left for patient to return my call.  

## 2017-08-20 DIAGNOSIS — F329 Major depressive disorder, single episode, unspecified: Secondary | ICD-10-CM | POA: Diagnosis not present

## 2017-08-20 NOTE — Telephone Encounter (Signed)
Message left for patient to return my call.  Also sent patient a message through [place]

## 2017-08-22 NOTE — Telephone Encounter (Signed)
Sending letter reminding patient to schedule follow up

## 2017-08-26 ENCOUNTER — Ambulatory Visit: Payer: Medicare Other | Admitting: Psychology

## 2017-09-04 ENCOUNTER — Other Ambulatory Visit: Payer: Self-pay | Admitting: Primary Care

## 2017-09-04 DIAGNOSIS — M62838 Other muscle spasm: Secondary | ICD-10-CM

## 2017-09-09 ENCOUNTER — Ambulatory Visit (INDEPENDENT_AMBULATORY_CARE_PROVIDER_SITE_OTHER): Payer: Medicare Other | Admitting: Psychology

## 2017-09-09 DIAGNOSIS — F330 Major depressive disorder, recurrent, mild: Secondary | ICD-10-CM | POA: Diagnosis not present

## 2017-09-22 DIAGNOSIS — F329 Major depressive disorder, single episode, unspecified: Secondary | ICD-10-CM | POA: Diagnosis not present

## 2017-09-23 ENCOUNTER — Ambulatory Visit: Payer: Medicare Other | Admitting: Psychology

## 2017-10-07 ENCOUNTER — Ambulatory Visit: Payer: Medicare Other | Admitting: Psychology

## 2017-10-21 ENCOUNTER — Ambulatory Visit (INDEPENDENT_AMBULATORY_CARE_PROVIDER_SITE_OTHER): Payer: Medicare Other | Admitting: Psychology

## 2017-10-21 DIAGNOSIS — F330 Major depressive disorder, recurrent, mild: Secondary | ICD-10-CM | POA: Diagnosis not present

## 2017-11-04 ENCOUNTER — Ambulatory Visit: Payer: Medicare Other | Admitting: Psychology

## 2017-11-12 ENCOUNTER — Telehealth: Payer: Self-pay | Admitting: Primary Care

## 2017-11-12 DIAGNOSIS — J4540 Moderate persistent asthma, uncomplicated: Secondary | ICD-10-CM

## 2017-11-12 MED ORDER — ALBUTEROL SULFATE HFA 108 (90 BASE) MCG/ACT IN AERS: 2.0000 | INHALATION_SPRAY | RESPIRATORY_TRACT | 1 refills | Status: DC | PRN

## 2017-11-12 NOTE — Telephone Encounter (Signed)
Copied from [place] [phone]. Topic: Quick Communication - See Telephone Encounter >> Nov 12, 2017 12:06 PM Vernona Rieger wrote: CRM for notification. See Telephone encounter for: 11/12/17.  [facility] called for a refill on albuterol (PROVENTIL HFA;VENTOLIN HFA) 108 (90 Base) MCG/ACT inhaler  [facility] [place] on [address]

## 2017-11-25 ENCOUNTER — Encounter: Payer: Self-pay | Admitting: Primary Care

## 2017-11-25 ENCOUNTER — Ambulatory Visit (INDEPENDENT_AMBULATORY_CARE_PROVIDER_SITE_OTHER): Payer: Medicare Other | Admitting: Primary Care

## 2017-11-25 DIAGNOSIS — F419 Anxiety disorder, unspecified: Secondary | ICD-10-CM | POA: Diagnosis not present

## 2017-11-25 DIAGNOSIS — M542 Cervicalgia: Secondary | ICD-10-CM | POA: Diagnosis not present

## 2017-11-25 DIAGNOSIS — K5190 Ulcerative colitis, unspecified, without complications: Secondary | ICD-10-CM

## 2017-11-25 DIAGNOSIS — G8929 Other chronic pain: Secondary | ICD-10-CM | POA: Diagnosis not present

## 2017-11-25 DIAGNOSIS — J4520 Mild intermittent asthma, uncomplicated: Secondary | ICD-10-CM

## 2017-11-25 DIAGNOSIS — F329 Major depressive disorder, single episode, unspecified: Secondary | ICD-10-CM | POA: Diagnosis not present

## 2017-11-25 DIAGNOSIS — I10 Essential (primary) hypertension: Secondary | ICD-10-CM | POA: Diagnosis not present

## 2017-11-25 DIAGNOSIS — M62838 Other muscle spasm: Secondary | ICD-10-CM | POA: Diagnosis not present

## 2017-11-25 MED ORDER — TIZANIDINE HCL 4 MG PO TABS: ORAL_TABLET | ORAL | 0 refills | Status: DC

## 2017-11-25 MED ORDER — AMLODIPINE BESY-BENAZEPRIL HCL 10-20 MG PO CAPS: 1.0000 | ORAL_CAPSULE | Freq: Every day | ORAL | 3 refills | Status: DC

## 2017-11-25 NOTE — Assessment & Plan Note (Signed)
Doing very well on current regime, no recent flares. Continue GI follow up.

## 2017-11-25 NOTE — Progress Notes (Signed)
Subjective:    Patient ID: Katelyn Dillon, female    DOB: 1941/06/08, 77 y.o.   MRN: [id number]  HPI  Katelyn Dillon is a 77 year old female who presents today for medication refills and a chief complaint of recurrent skin infections.  1) Chronic Neck Pain: Currently managed on Tizanidine 4 mg PRN. She is doing very well on this regimen and is using sparingly.  2) Anxiety and Depression: Currently managed on Paxil 40 mg and Buspar 7.5 mg BID. She feels well managed on her regimen. Denies SI/HI.  3) Essential Hypertension: Currently managed on amlodipine-benazepril 10-20 mg.  BP Readings from Last 3 Encounters:  11/25/17 124/80  07/17/17 (!) 155/78  06/30/17 (!) 141/87      4) Ulcerative Colitis: Currently managed on Purinethol 50 mg and is following with GI. She denies recent flares since starting on Purinethol 50 mg.   5) Asthma: Currently managed on Flovent HFA and albuterol HFA. She's using the Flovent daily and the albuterol inhaler twice weekly on average.   6) Skin Irritation: Located to the left flank for which she noticed one week ago. She's been applying neosporin and bandages with improvement. She has a history of cellulitis from boils in the past. She denies fevers, increased redness, increased discomfort, drainage.   Review of Systems  Constitutional: Negative for fever.  Respiratory: Negative for shortness of breath and wheezing.   Cardiovascular: Negative for chest pain.  Gastrointestinal: Negative for abdominal pain and blood in stool.  Musculoskeletal:       Chronic neck pain, feels well managed.  Skin: Positive for wound.  Neurological: Negative for dizziness and numbness.       Past Medical History:  Diagnosis Date  . Allergy   . Anxiety   . Asthma   . Chicken pox   . Depression   . Hypertension   . Insomnia   . Ulcerative colitis ([place])      Social History   Socioeconomic History  . Marital status: Single    Spouse name: Not on file  . Number  of children: Not on file  . Years of education: Not on file  . Highest education level: Not on file  Occupational History  . Not on file  Social Needs  . Financial resource strain: Not on file  . Food insecurity:    Worry: Not on file    Inability: Not on file  . Transportation needs:    Medical: Not on file    Non-medical: Not on file  Tobacco Use  . Smoking status: Never Smoker  . Smokeless tobacco: Never Used  Substance and Sexual Activity  . Alcohol use: Yes    Alcohol/week: 0.0 oz    Comment: social  . Drug use: No  . Sexual activity: Not on file  Lifestyle  . Physical activity:    Days per week: Not on file    Minutes per session: Not on file  . Stress: Not on file  Relationships  . Social connections:    Talks on phone: Not on file    Gets together: Not on file    Attends religious service: Not on file    Active member of club or organization: Not on file    Attends meetings of clubs or organizations: Not on file    Relationship status: Not on file  . Intimate partner violence:    Fear of current or ex partner: Not on file    Emotionally abused: Not on  file    Physically abused: Not on file    Forced sexual activity: Not on file  Other Topics Concern  . Not on file  Social History Narrative   Widowed. In a relationship.   Three children.   Recently moved from [place].   Enjoys working outdoors, sewing.    Past Surgical History:  Procedure Laterality Date  . ABDOMINAL HYSTERECTOMY  2000  . CATARACT EXTRACTION W/PHACO Right 06/19/2017   Procedure: CATARACT EXTRACTION PHACO AND INTRAOCULAR LENS PLACEMENT (IOC)-RIGHT;  Surgeon: Eulogio Bear, MD;  Location: [facility] ORS;  Service: Ophthalmology;  Laterality: Right;  [place] 00:31.4 AP% 11.6 CDE 3.66 Fluid Pack lot # [id number]  . CATARACT EXTRACTION W/PHACO Left 07/17/2017   Procedure: CATARACT EXTRACTION PHACO AND INTRAOCULAR LENS PLACEMENT (IOC);  Surgeon: Eulogio Bear, MD;  Location: [facility] ORS;  Service:  Ophthalmology;  Laterality: Left;  [place]   00:29.1 AP%   12.5 CDE   3.64 Fluid Pack Lot # [id number]  . COLONOSCOPY WITH PROPOFOL N/A 02/13/2016   Procedure: COLONOSCOPY WITH PROPOFOL;  Surgeon: Lollie Sails, MD;  Location: [facility] ENDOSCOPY;  Service: Endoscopy;  Laterality: N/A;  . COLONOSCOPY WITH PROPOFOL N/A 05/31/2016   Procedure: COLONOSCOPY WITH PROPOFOL;  Surgeon: Lollie Sails, MD;  Location: [facility] ENDOSCOPY;  Service: Endoscopy;  Laterality: N/A;  . JOINT REPLACEMENT Right 2013  . REPLACEMENT UNICONDYLAR JOINT KNEE      Family History  Problem Relation Age of Onset  . Hypertension Mother   . Hypertension Father     No Known Allergies  Current Outpatient Medications on File Prior to Visit  Medication Sig Dispense Refill  . albuterol (PROVENTIL HFA;VENTOLIN HFA) 108 (90 Base) MCG/ACT inhaler Inhale 2 puffs into the lungs every 4 (four) hours as needed for wheezing or shortness of breath. 1 Inhaler 1  . busPIRone (BUSPAR) 7.5 MG tablet take 1 tablet by mouth twice a day 180 tablet 1  . fluticasone (FLOVENT HFA) 44 MCG/ACT inhaler INHALE 2 PUFFS INTO LUNGS TWO TIMES A DAY 10.6 g 11  . mercaptopurine (PURINETHOL) 50 MG tablet Take 1 tablet by mouth daily.  1  . PARoxetine (PAXIL) 40 MG tablet Take 0.5 tablets (20 mg total) by mouth daily. 45 tablet 1   No current facility-administered medications on file prior to visit.     BP 124/80   Pulse 76   Temp 98.1 F (36.7 C) (Oral)   Ht 5' 2"  (1.575 m)   Wt 131 lb 8 oz (59.6 kg)   SpO2 97%   BMI 24.05 kg/m    Objective:   Physical Exam  Constitutional: She is oriented to person, place, and time. She appears well-nourished.  Neck: Neck supple.  Cardiovascular: Normal rate and regular rhythm.  Respiratory: Effort normal and breath sounds normal.  Neurological: She is alert and oriented to person, place, and time.  Skin: Skin is warm and dry.           Assessment & Plan:  Skin Irritation:  Scabbing with mild area  or erythema to left flank. Appears irritated without infection. No underlying cellulitis. Will have her continue with conservative home measures and monitor.  Pleas Koch, NP

## 2017-11-25 NOTE — Assessment & Plan Note (Signed)
Stable in the office today, continue Lotrel.  CMP is up to date through [place]. She will have repeat labs this Summer through her GI MD.

## 2017-11-25 NOTE — Assessment & Plan Note (Signed)
Well controlled. Continue daily Flovent, PRN albuterol. No wheezing on exam.

## 2017-11-25 NOTE — Assessment & Plan Note (Signed)
Doing very well on current regimen. Continue Paxil and Buspar.

## 2017-11-25 NOTE — Patient Instructions (Signed)
Follow up with GI as recommended.  I'll send refills of your other medications once needed.  I sent refills for amlodipine-benazepril 10-20 and tizanidine today.  Please notify me if the redness enlarges, you notice a firm/painful mass under the site, you develop fevers.   Follow up in 1 year for re-evaluation or sooner if needed.  It was a pleasure to see you today!

## 2017-11-25 NOTE — Assessment & Plan Note (Signed)
Using Tizanidine sparingly, continue same.

## 2017-12-04 DIAGNOSIS — K5100 Ulcerative (chronic) pancolitis without complications: Secondary | ICD-10-CM | POA: Diagnosis not present
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
## 2018-01-30 DIAGNOSIS — R197 Diarrhea, unspecified: Secondary | ICD-10-CM | POA: Diagnosis not present

## 2018-01-30 DIAGNOSIS — K5100 Ulcerative (chronic) pancolitis without complications: Secondary | ICD-10-CM | POA: Diagnosis not present

## 2018-02-02 ENCOUNTER — Other Ambulatory Visit: Payer: Self-pay | Admitting: Gastroenterology

## 2018-02-02 DIAGNOSIS — K529 Noninfective gastroenteritis and colitis, unspecified: Secondary | ICD-10-CM

## 2018-02-02 DIAGNOSIS — K625 Hemorrhage of anus and rectum: Secondary | ICD-10-CM | POA: Diagnosis not present

## 2018-02-02 DIAGNOSIS — F329 Major depressive disorder, single episode, unspecified: Secondary | ICD-10-CM | POA: Diagnosis not present

## 2018-02-02 DIAGNOSIS — K51019 Ulcerative (chronic) pancolitis with unspecified complications: Secondary | ICD-10-CM | POA: Diagnosis not present

## 2018-02-03 ENCOUNTER — Other Ambulatory Visit
Admission: RE | Admit: 2018-02-03 | Discharge: 2018-02-03 | Disposition: A | Discharge status: Home / Self Care | Payer: Medicare Other | Source: Ambulatory Visit | Attending: Gastroenterology | Admitting: Gastroenterology

## 2018-02-03 DIAGNOSIS — R197 Diarrhea, unspecified: Secondary | ICD-10-CM | POA: Diagnosis not present

## 2018-02-03 LAB — C DIFFICILE QUICK SCREEN W PCR REFLEX
C Diff antigen: NEGATIVE
C Diff interpretation: NOT DETECTED
C Diff toxin: NEGATIVE

## 2018-02-03 LAB — GASTROINTESTINAL PANEL BY PCR, STOOL (REPLACES STOOL CULTURE)
ADENOVIRUS F40/41: NOT DETECTED
Astrovirus: NOT DETECTED
CAMPYLOBACTER SPECIES: NOT DETECTED
CRYPTOSPORIDIUM: NOT DETECTED
CYCLOSPORA CAYETANENSIS: NOT DETECTED
Entamoeba histolytica: NOT DETECTED
Enteroaggregative E coli (EAEC): NOT DETECTED
Enteropathogenic E coli (EPEC): NOT DETECTED
Enterotoxigenic E coli (ETEC): NOT DETECTED
Giardia lamblia: NOT DETECTED
Norovirus GI/GII: NOT DETECTED
PLESIMONAS SHIGELLOIDES: NOT DETECTED
Rotavirus A: NOT DETECTED
SAPOVIRUS (I, II, IV, AND V): NOT DETECTED
SHIGA LIKE TOXIN PRODUCING E COLI (STEC): NOT DETECTED
Salmonella species: NOT DETECTED
Shigella/Enteroinvasive E coli (EIEC): NOT DETECTED
VIBRIO SPECIES: NOT DETECTED
Vibrio cholerae: NOT DETECTED
YERSINIA ENTEROCOLITICA: NOT DETECTED

## 2018-02-04 ENCOUNTER — Ambulatory Visit
Admission: RE | Admit: 2018-02-04 | Discharge: 2018-02-04 | Disposition: A | Discharge status: Home / Self Care | Payer: Medicare Other | Source: Ambulatory Visit | Attending: Gastroenterology | Admitting: Gastroenterology

## 2018-02-04 DIAGNOSIS — K529 Noninfective gastroenteritis and colitis, unspecified: Secondary | ICD-10-CM | POA: Diagnosis not present

## 2018-02-04 LAB — CALPROTECTIN, FECAL: Calprotectin, Fecal: 2218 ug/g — ABNORMAL HIGH (ref 0–120)

## 2018-02-04 MED ORDER — IOPAMIDOL (ISOVUE-300) INJECTION 61%
100.0000 mL | Freq: Once | INTRAVENOUS | Status: AC | PRN
  Administered 2018-02-04: 100 mL via INTRAVENOUS

## 2018-02-10 DIAGNOSIS — K5100 Ulcerative (chronic) pancolitis without complications: Secondary | ICD-10-CM | POA: Diagnosis not present

## 2018-02-12 ENCOUNTER — Other Ambulatory Visit: Payer: Self-pay | Admitting: Primary Care

## 2018-02-12 DIAGNOSIS — M62838 Other muscle spasm: Secondary | ICD-10-CM

## 2018-02-25 ENCOUNTER — Other Ambulatory Visit: Payer: Self-pay | Admitting: Primary Care

## 2018-02-25 DIAGNOSIS — J4540 Moderate persistent asthma, uncomplicated: Secondary | ICD-10-CM

## 2018-03-04 DIAGNOSIS — F329 Major depressive disorder, single episode, unspecified: Secondary | ICD-10-CM | POA: Diagnosis not present

## 2018-04-09 ENCOUNTER — Other Ambulatory Visit: Payer: Self-pay | Admitting: Primary Care

## 2018-04-09 DIAGNOSIS — J4540 Moderate persistent asthma, uncomplicated: Secondary | ICD-10-CM

## 2018-05-25 ENCOUNTER — Ambulatory Visit (INDEPENDENT_AMBULATORY_CARE_PROVIDER_SITE_OTHER): Payer: Medicare Other | Admitting: Psychiatry

## 2018-05-25 ENCOUNTER — Encounter: Payer: Self-pay | Admitting: Psychiatry

## 2018-05-25 VITALS — BP 146/76 | HR 77

## 2018-05-25 DIAGNOSIS — F5101 Primary insomnia: Secondary | ICD-10-CM | POA: Diagnosis not present

## 2018-05-25 DIAGNOSIS — F411 Generalized anxiety disorder: Secondary | ICD-10-CM | POA: Diagnosis not present

## 2018-05-25 DIAGNOSIS — F331 Major depressive disorder, recurrent, moderate: Secondary | ICD-10-CM

## 2018-05-25 MED ORDER — DULOXETINE HCL 60 MG PO CPEP: 60.0000 mg | ORAL_CAPSULE | Freq: Every day | ORAL | 0 refills | Status: DC

## 2018-05-25 MED ORDER — DULOXETINE HCL 30 MG PO CPEP: 30.0000 mg | ORAL_CAPSULE | Freq: Every day | ORAL | 0 refills | Status: DC

## 2018-05-25 NOTE — Progress Notes (Signed)
KEYLE DOBY [id number] 01-05-1941 77 y.o.  Subjective:   Patient ID:  Katelyn Dillon is a 77 y.o. (DOB 18-May-1941) female.  Chief Complaint:  Chief Complaint  Patient presents with  . Depression  . Anxiety    HPI Katelyn Dillon presents to the office today for follow-up of depression and anxiety. Mood has been depressed and "I just don't care." She reports that she has had low energy and motivation. "I just don't have the get up and go." She reports that she had to push herself to shower and get ready. She reports that they are in the process of moving and is overwhelmed by this. Denies irritability. Reports that she has had anxiety and worry. Denies any panic attacks- "I may have come close to having one the other week." She reports anhedonia and is not looking forward to the holidays. She reports that her sleep is "off and on" and will have periods where sleep is adequate and other times sleep is poor. Reports occasional "weird dreams." She reports that her appetite has been ok. She reports that her concentration is poor and that she has not been able to read. Reports that she has been more withdrawn. Denies SI.   "I'm sure the meds are doing something but they are not doing as much as I need them to do."  Son is coming for Christmas.   Past medication trials: Paxil- initially helpful than ineffective Effexor may have been helpful during the past BuSpar Wellbutrin-disturbing dreams Cymbalta Abilify Ativan  Review of Systems:  Review of Systems  Gastrointestinal: Negative.   Musculoskeletal: Negative for gait problem.  Neurological: Negative for tremors.  Psychiatric/Behavioral:       Please refer to HPI    Medications: I have reviewed the patient's current medications.  Current Outpatient Medications  Medication Sig Dispense Refill  . albuterol (PROVENTIL HFA;VENTOLIN HFA) 108 (90 Base) MCG/ACT inhaler INHALE 2 PUFFS INTO THE LUNGS EVERY 4 (FOUR) HOURS AS NEEDED FOR  WHEEZING OR SHORTNESS OF BREATH. 1 Inhaler 0  . amLODipine-benazepril (LOTREL) 10-20 MG capsule Take 1 capsule by mouth daily. 90 capsule 3  . ARIPiprazole (ABILIFY) 5 MG tablet Take 5 mg by mouth daily.    . busPIRone (BUSPAR) 7.5 MG tablet take 1 tablet by mouth twice a day 180 tablet 1  . DULoxetine (CYMBALTA) 60 MG capsule Take 1 capsule (60 mg total) by mouth daily. Take with 30 mg capsule to equal 90 mg 90 capsule 0  . fluticasone (FLOVENT HFA) 44 MCG/ACT inhaler INHALE 2 PUFFS INTO LUNGS TWO TIMES A DAY 10.6 g 11  . mercaptopurine (PURINETHOL) 50 MG tablet Take 1 tablet by mouth daily.  1  . tiZANidine (ZANAFLEX) 4 MG tablet TAKE 1 TABLET BY MOUTH EVERY 6 HOURS IF NEEDED FOR MUSCLE SPASM 90 tablet 0  . DULoxetine (CYMBALTA) 30 MG capsule Take 1 capsule (30 mg total) by mouth daily. Take with 60 mg capsule to equal 90 mg. 90 capsule 0   No current facility-administered medications for this visit.     Medication Side Effects: None  Allergies: No Known Allergies  Past Medical History:  Diagnosis Date  . Allergy   . Anxiety   . Asthma   . Chicken pox   . Depression   . Hypertension   . Insomnia   . Ulcerative colitis ([place])     Family History  Problem Relation Age of Onset  . Hypertension Mother   . Hypertension Father   .  Depression Daughter   . Suicidality Grandchild   . Depression Other     Social History   Socioeconomic History  . Marital status: Single    Spouse name: Not on file  . Number of children: Not on file  . Years of education: Not on file  . Highest education level: Not on file  Occupational History  . Not on file  Social Needs  . Financial resource strain: Not on file  . Food insecurity:    Worry: Not on file    Inability: Not on file  . Transportation needs:    Medical: Not on file    Non-medical: Not on file  Tobacco Use  . Smoking status: Never Smoker  . Smokeless tobacco: Never Used  Substance and Sexual Activity  . Alcohol use: Yes     Alcohol/week: 0.0 standard drinks    Comment: social  . Drug use: No  . Sexual activity: Not on file  Lifestyle  . Physical activity:    Days per week: Not on file    Minutes per session: Not on file  . Stress: Not on file  Relationships  . Social connections:    Talks on phone: Not on file    Gets together: Not on file    Attends religious service: Not on file    Active member of club or organization: Not on file    Attends meetings of clubs or organizations: Not on file    Relationship status: Not on file  . Intimate partner violence:    Fear of current or ex partner: Not on file    Emotionally abused: Not on file    Physically abused: Not on file    Forced sexual activity: Not on file  Other Topics Concern  . Not on file  Social History Narrative   Widowed. In a relationship.   Three children.   Recently moved from [place].   Enjoys working outdoors, sewing.    Past Medical History, Surgical history, Social history, and Family history were reviewed and updated as appropriate.   Please see review of systems for further details on the patient's review from today.   Objective:   Physical Exam:  BP (!) 146/76   Pulse 77   Physical Exam  Constitutional: She is oriented to person, place, and time. She appears well-developed. No distress.  Musculoskeletal: She exhibits no deformity.  Neurological: She is alert and oriented to person, place, and time. Coordination normal.  Psychiatric: Her speech is normal and behavior is normal. Judgment and thought content normal. Her mood appears anxious. Her affect is not angry, not blunt, not labile and not inappropriate. Cognition and memory are normal. She exhibits a depressed mood. She expresses no homicidal and no suicidal ideation. She expresses no suicidal plans and no homicidal plans.  Mood presents as mildly irritable Insight intact. No auditory or visual hallucinations. No delusions.     Lab Review:     Component Value  Date/Time   NA 140 11/23/2015 1226   K 3.9 11/23/2015 1226   CL 104 11/23/2015 1226   CO2 28 11/23/2015 1226   GLUCOSE 97 11/23/2015 1226   BUN 18 11/23/2015 1226   CREATININE 0.86 11/23/2015 1226   CALCIUM 10.2 11/23/2015 1226   PROT 7.0 02/06/2015 1038   ALBUMIN 4.2 02/06/2015 1038   AST 16 02/06/2015 1038   ALT 11 02/06/2015 1038   ALKPHOS 117 02/06/2015 1038   BILITOT 0.8 02/06/2015 1038  Component Value Date/Time   WBC 7.5 06/30/2017 1018   RBC 4.32 06/30/2017 1018   HGB 13.3 06/30/2017 1018   HCT 40.2 06/30/2017 1018   PLT 363 06/30/2017 1018   MCV 93.1 06/30/2017 1018   MCH 30.7 06/30/2017 1018   MCHC 33.0 06/30/2017 1018   RDW 15.8 (H) 06/30/2017 1018   LYMPHSABS 1.3 06/30/2017 1018   MONOABS 0.6 06/30/2017 1018   EOSABS 0.2 06/30/2017 1018   BASOSABS 0.1 06/30/2017 1018    No results found for: POCLITH, LITHIUM   No results found for: PHENYTOIN, PHENOBARB, VALPROATE, CBMZ   .res Assessment: Plan:   Patient seen for 30 minutes and greater than 50% of visit spent counseling patient regarding possible treatment options to include potential benefits, risks, and side effects of increasing Cymbalta to 90 mg daily for mood and anxiety.  Discussed that Cymbalta is approved up to 60 mg daily and that 90 mg is an off label use.  Patient verbalizes understanding and agrees to increase in Cymbalta to 90 mg daily. Will continue Abilify 5 mg daily for depression Will continue BuSpar for anxiety. Moderate episode of recurrent major depressive disorder (HCC) - Plan: DULoxetine (CYMBALTA) 60 MG capsule, DULoxetine (CYMBALTA) 30 MG capsule  Generalized anxiety disorder - Plan: DULoxetine (CYMBALTA) 60 MG capsule, DULoxetine (CYMBALTA) 30 MG capsule  Primary insomnia  Please see After Visit Summary for patient specific instructions.  Future Appointments  Date Time Provider [place]  08/10/2018 11:00 AM Thayer Headings, PMHNP [facility] None    No orders of the  defined types were placed in this encounter.     -------------------------------

## 2018-07-24 ENCOUNTER — Encounter: Payer: Self-pay | Admitting: Emergency Medicine

## 2018-07-24 DIAGNOSIS — F330 Major depressive disorder, recurrent, mild: Secondary | ICD-10-CM | POA: Insufficient documentation

## 2018-07-24 DIAGNOSIS — F419 Anxiety disorder, unspecified: Secondary | ICD-10-CM | POA: Insufficient documentation

## 2018-08-10 ENCOUNTER — Encounter: Payer: Self-pay | Admitting: Psychiatry

## 2018-08-10 ENCOUNTER — Ambulatory Visit (INDEPENDENT_AMBULATORY_CARE_PROVIDER_SITE_OTHER): Payer: Medicare Other | Admitting: Psychiatry

## 2018-08-10 VITALS — BP 141/81 | HR 78

## 2018-08-10 DIAGNOSIS — F411 Generalized anxiety disorder: Secondary | ICD-10-CM

## 2018-08-10 DIAGNOSIS — F5101 Primary insomnia: Secondary | ICD-10-CM

## 2018-08-10 DIAGNOSIS — F331 Major depressive disorder, recurrent, moderate: Secondary | ICD-10-CM | POA: Diagnosis not present

## 2018-08-10 MED ORDER — DULOXETINE HCL 30 MG PO CPEP: 30.0000 mg | ORAL_CAPSULE | Freq: Every day | ORAL | 0 refills | Status: DC

## 2018-08-10 MED ORDER — ARIPIPRAZOLE 5 MG PO TABS: 10.0000 mg | ORAL_TABLET | Freq: Every day | ORAL | 0 refills | Status: DC

## 2018-08-10 NOTE — Progress Notes (Signed)
Katelyn Dillon [id number] June 03, 1941 78 y.o.  Subjective:   Patient ID:  Katelyn Dillon is a 78 y.o. (DOB Nov 06, 1940) female.  Chief Complaint:  Chief Complaint  Patient presents with  . Depression  . Anxiety  . Insomnia    HPI Katelyn Dillon presents to the office today for follow-up of depression, anxiety, and insomnia. She reports "I have my good days and my bad days." She reports that she has not noticed a significant improvement with increae in Cymbalta to 90 mg qd. She reports that she does not like winter and that her mood is typically lower in the winter. She reports that her mood improves when prednisone is prescribed for UC flares. Reports that mood is currently depressed and she has been having anxiety. Reports frequent worry. Reports anxiety with putting away her belongings and getting rid of some of her belongings. Reports that she slept only 2 hours last night due to worry and anxiety. Reports that she never sleeps throughout the night without interruption. Has occasional vivid dreams. Reports that she has had low energy and motivation. Appetite has been "just ok." Reports that appetite is lower some days. She reports poor concentration and focus. She reports occasional passive death wishes. Denies SI- "I would never do that."  Just moved. Met a new neighbor that she thinks she will enjoy.   Past medication trials: Paxil- initially helpful than ineffective Effexor may have been helpful during the past BuSpar Wellbutrin-disturbing dreams Cymbalta- Initially helpful then had recurrent depression. No improvement with increase to 90 mg.  Abilify Ativan  Review of Systems:  Review of Systems  Musculoskeletal: Negative for gait problem.  Neurological: Negative for tremors.  Psychiatric/Behavioral:       Please refer to HPI    Medications: I have reviewed the patient's current medications.  Current Outpatient Medications  Medication Sig Dispense Refill  . albuterol  (PROVENTIL HFA;VENTOLIN HFA) 108 (90 Base) MCG/ACT inhaler INHALE 2 PUFFS INTO THE LUNGS EVERY 4 (FOUR) HOURS AS NEEDED FOR WHEEZING OR SHORTNESS OF BREATH. 1 Inhaler 0  . amLODipine-benazepril (LOTREL) 10-20 MG capsule Take 1 capsule by mouth daily. 90 capsule 3  . ARIPiprazole (ABILIFY) 5 MG tablet Take 2 tablets (10 mg total) by mouth daily for 30 days. 60 tablet 0  . busPIRone (BUSPAR) 7.5 MG tablet take 1 tablet by mouth twice a day 180 tablet 1  . DULoxetine (CYMBALTA) 30 MG capsule Take 1 capsule (30 mg total) by mouth daily. Take with 60 mg capsule to equal 90 mg. 30 capsule 0  . DULoxetine (CYMBALTA) 60 MG capsule Take 1 capsule (60 mg total) by mouth daily. Take with 30 mg capsule to equal 90 mg 90 capsule 0  . fluticasone (FLOVENT HFA) 44 MCG/ACT inhaler INHALE 2 PUFFS INTO LUNGS TWO TIMES A DAY 10.6 g 11  . mercaptopurine (PURINETHOL) 50 MG tablet Take 1 tablet by mouth daily.  1  . tiZANidine (ZANAFLEX) 4 MG tablet TAKE 1 TABLET BY MOUTH EVERY 6 HOURS IF NEEDED FOR MUSCLE SPASM (Patient not taking: Reported on 08/10/2018) 90 tablet 0   No current facility-administered medications for this visit.     Medication Side Effects: None  Allergies: No Known Allergies  Past Medical History:  Diagnosis Date  . Allergy   . Anxiety   . Asthma   . Chicken pox   . Depression   . Hypertension   . Insomnia   . Ulcerative colitis ([place])     Family History  Problem Relation Age of Onset  . Hypertension Mother   . Hypertension Father   . Depression Daughter   . Suicidality Grandchild   . Depression Other     Social History   Socioeconomic History  . Marital status: Single    Spouse name: Not on file  . Number of children: Not on file  . Years of education: Not on file  . Highest education level: Not on file  Occupational History  . Not on file  Social Needs  . Financial resource strain: Not on file  . Food insecurity:    Worry: Not on file    Inability: Not on file  .  Transportation needs:    Medical: Not on file    Non-medical: Not on file  Tobacco Use  . Smoking status: Never Smoker  . Smokeless tobacco: Never Used  Substance and Sexual Activity  . Alcohol use: Yes    Alcohol/week: 0.0 standard drinks    Comment: social  . Drug use: No  . Sexual activity: Not on file  Lifestyle  . Physical activity:    Days per week: Not on file    Minutes per session: Not on file  . Stress: Not on file  Relationships  . Social connections:    Talks on phone: Not on file    Gets together: Not on file    Attends religious service: Not on file    Active member of club or organization: Not on file    Attends meetings of clubs or organizations: Not on file    Relationship status: Not on file  . Intimate partner violence:    Fear of current or ex partner: Not on file    Emotionally abused: Not on file    Physically abused: Not on file    Forced sexual activity: Not on file  Other Topics Concern  . Not on file  Social History Narrative   Widowed. In a relationship.   Three children.   Recently moved from [place].   Enjoys working outdoors, sewing.    Past Medical History, Surgical history, Social history, and Family history were reviewed and updated as appropriate.   Please see review of systems for further details on the patient's review from today.   Objective:   Physical Exam:  BP (!) 141/81   Pulse 78   Physical Exam Constitutional:      General: She is not in acute distress.    Appearance: She is well-developed.  Musculoskeletal:        General: No deformity.  Neurological:     Mental Status: She is alert and oriented to person, place, and time.     Coordination: Coordination normal.  Psychiatric:        Attention and Perception: Attention and perception normal. She does not perceive auditory or visual hallucinations.        Mood and Affect: Mood is anxious and depressed. Affect is not labile, blunt, angry or inappropriate.         Speech: Speech normal.        Behavior: Behavior normal.        Thought Content: Thought content normal. Thought content does not include homicidal or suicidal ideation. Thought content does not include homicidal or suicidal plan.        Cognition and Memory: Cognition and memory normal.        Judgment: Judgment normal.     Comments: Insight intact. No delusions.      Lab  Review:     Component Value Date/Time   NA 140 11/23/2015 1226   K 3.9 11/23/2015 1226   CL 104 11/23/2015 1226   CO2 28 11/23/2015 1226   GLUCOSE 97 11/23/2015 1226   BUN 18 11/23/2015 1226   CREATININE 0.86 11/23/2015 1226   CALCIUM 10.2 11/23/2015 1226   PROT 7.0 02/06/2015 1038   ALBUMIN 4.2 02/06/2015 1038   AST 16 02/06/2015 1038   ALT 11 02/06/2015 1038   ALKPHOS 117 02/06/2015 1038   BILITOT 0.8 02/06/2015 1038       Component Value Date/Time   WBC 7.5 06/30/2017 1018   RBC 4.32 06/30/2017 1018   HGB 13.3 06/30/2017 1018   HCT 40.2 06/30/2017 1018   PLT 363 06/30/2017 1018   MCV 93.1 06/30/2017 1018   MCH 30.7 06/30/2017 1018   MCHC 33.0 06/30/2017 1018   RDW 15.8 (H) 06/30/2017 1018   LYMPHSABS 1.3 06/30/2017 1018   MONOABS 0.6 06/30/2017 1018   EOSABS 0.2 06/30/2017 1018   BASOSABS 0.1 06/30/2017 1018    No results found for: POCLITH, LITHIUM   No results found for: PHENYTOIN, PHENOBARB, VALPROATE, CBMZ   .res Assessment: Plan:   Discussed potential benefits, risk, and side effects of increasing Abilify to 10 mg daily since patient previously had partial response to 5 mg of Abilify doses of Abilify can sometimes be helpful for anxiety.  Patient agrees to trial of Abilify 10 mg daily to possibly improve anxiety and depression. Discussed considering increase in BuSpar in the future to improve anxiety. Will continue Cymbalta 90 mg daily for depression and anxiety and consider possible change of Cymbalta in the future if no significant improvement in mood and anxiety.  Moderate episode  of recurrent major depressive disorder (HCC) - Plan: ARIPiprazole (ABILIFY) 5 MG tablet, DULoxetine (CYMBALTA) 30 MG capsule  Generalized anxiety disorder - Plan: DULoxetine (CYMBALTA) 30 MG capsule  Primary insomnia  Please see After Visit Summary for patient specific instructions.  Future Appointments  Date Time Provider [place]  09/08/2018 11:00 AM Thayer Headings, PMHNP [facility] None    No orders of the defined types were placed in this encounter.     -------------------------------

## 2018-08-16 ENCOUNTER — Other Ambulatory Visit: Payer: Self-pay | Admitting: Psychiatry

## 2018-08-16 DIAGNOSIS — F331 Major depressive disorder, recurrent, moderate: Secondary | ICD-10-CM

## 2018-08-16 DIAGNOSIS — F411 Generalized anxiety disorder: Secondary | ICD-10-CM

## 2018-08-24 ENCOUNTER — Other Ambulatory Visit: Payer: Self-pay | Admitting: Psychiatry

## 2018-08-24 DIAGNOSIS — F411 Generalized anxiety disorder: Secondary | ICD-10-CM

## 2018-08-24 DIAGNOSIS — F331 Major depressive disorder, recurrent, moderate: Secondary | ICD-10-CM

## 2018-09-01 ENCOUNTER — Other Ambulatory Visit: Payer: Self-pay | Admitting: Psychiatry

## 2018-09-01 ENCOUNTER — Other Ambulatory Visit: Payer: Self-pay | Admitting: Primary Care

## 2018-09-01 DIAGNOSIS — J4540 Moderate persistent asthma, uncomplicated: Secondary | ICD-10-CM

## 2018-09-01 DIAGNOSIS — F411 Generalized anxiety disorder: Secondary | ICD-10-CM

## 2018-09-01 DIAGNOSIS — F331 Major depressive disorder, recurrent, moderate: Secondary | ICD-10-CM

## 2018-09-03 ENCOUNTER — Other Ambulatory Visit: Payer: Self-pay

## 2018-09-03 DIAGNOSIS — F331 Major depressive disorder, recurrent, moderate: Secondary | ICD-10-CM

## 2018-09-03 DIAGNOSIS — F411 Generalized anxiety disorder: Secondary | ICD-10-CM

## 2018-09-03 MED ORDER — DULOXETINE HCL 30 MG PO CPEP: ORAL_CAPSULE | ORAL | 0 refills | Status: DC

## 2018-09-08 ENCOUNTER — Ambulatory Visit (INDEPENDENT_AMBULATORY_CARE_PROVIDER_SITE_OTHER): Payer: Medicare Other | Admitting: Psychiatry

## 2018-09-08 ENCOUNTER — Encounter: Payer: Self-pay | Admitting: Psychiatry

## 2018-09-08 VITALS — BP 126/70 | HR 65

## 2018-09-08 DIAGNOSIS — F5101 Primary insomnia: Secondary | ICD-10-CM | POA: Diagnosis not present

## 2018-09-08 DIAGNOSIS — F411 Generalized anxiety disorder: Secondary | ICD-10-CM | POA: Diagnosis not present

## 2018-09-08 DIAGNOSIS — F3342 Major depressive disorder, recurrent, in full remission: Secondary | ICD-10-CM | POA: Diagnosis not present

## 2018-09-08 MED ORDER — DULOXETINE HCL 30 MG PO CPEP: ORAL_CAPSULE | ORAL | 0 refills | Status: DC

## 2018-09-08 MED ORDER — ARIPIPRAZOLE 5 MG PO TABS: 5.0000 mg | ORAL_TABLET | Freq: Every day | ORAL | 0 refills | Status: DC

## 2018-09-08 MED ORDER — DULOXETINE HCL 60 MG PO CPEP: 60.0000 mg | ORAL_CAPSULE | Freq: Every day | ORAL | 0 refills | Status: DC

## 2018-09-08 NOTE — Progress Notes (Signed)
Katelyn Dillon [id number] Jun 05, 1941 78 y.o.  Subjective:   Patient ID:  Katelyn Dillon is a 78 y.o. (DOB 1941-05-21) female.  Chief Complaint:  Chief Complaint  Patient presents with  . Follow-up    h/o anxiety, depression, and insomnia    HPI Katelyn Dillon presents to the office today for follow-up of depression and anxiety. "Things are much better- better than I have been in awhile." She reports "this is the best I have felt in about 1.5 years." She denies current depressed mood. She reports that her anxiety is currently well controlled. She reports that her sleep is not as good as she would like. She reports that she is awakened at night by motion lights coming on due to deer. Occasional difficulty falling asleep and middle of the night awakening. She reports long-standing insomnia. She reports that she is a light sleeper and is easily awakened. Energy and motivation have improved. Reports that she has been unpacking and doing some yard work. She reports adequate concentration. She reports that her appetite is fair. She reports that she has lost about 3 lbs. Denies SI.   She reports that she ran out of Abilify about a week ago.   She reports that her daughter is doing well and has completed cancer treatments. She reports that that she is happy to be out of the apartment. Reports that her brother's health has improved.   Past medication trials: Paxil- initially helpful than ineffective Effexor may have been helpful during the past BuSpar Wellbutrin-disturbing dreams Cymbalta- Initially helpful then had recurrent depression. No improvement with increase to 90 mg.  Abilify Ativan  Review of Systems:  Review of Systems  Musculoskeletal: Negative for gait problem.       Some muscle soreness after lifting boxes.   Neurological: Negative for tremors.  Psychiatric/Behavioral:       Please refer to HPI    Medications: I have reviewed the patient's current medications.  Current  Outpatient Medications  Medication Sig Dispense Refill  . amLODipine-benazepril (LOTREL) 10-20 MG capsule Take 1 capsule by mouth daily. 90 capsule 3  . ARIPiprazole (ABILIFY) 5 MG tablet Take 1 tablet (5 mg total) by mouth daily for 30 days. 30 tablet 0  . busPIRone (BUSPAR) 7.5 MG tablet take 1 tablet by mouth twice a day 180 tablet 1  . DULoxetine (CYMBALTA) 30 MG capsule TAKE 1 CAPSULE BY MOUTH DAILY. TAKE WITH 60 MG CAPSULE TO EQUAL 90 MG. 90 capsule 0  . DULoxetine (CYMBALTA) 60 MG capsule Take 1 capsule (60 mg total) by mouth daily. Take with 30 mg capsule to equal 90 mg 90 capsule 0  . mercaptopurine (PURINETHOL) 50 MG tablet Take 1 tablet by mouth daily.  1  . tiZANidine (ZANAFLEX) 4 MG tablet TAKE 1 TABLET BY MOUTH EVERY 6 HOURS IF NEEDED FOR MUSCLE SPASM 90 tablet 0  . albuterol (PROVENTIL HFA;VENTOLIN HFA) 108 (90 Base) MCG/ACT inhaler INHALE 2 PUFFS INTO THE LUNGS EVERY 4 (FOUR) HOURS AS NEEDED FOR WHEEZING OR SHORTNESS OF BREATH. 1 Inhaler 0  . fluticasone (FLOVENT HFA) 44 MCG/ACT inhaler INHALE 2 PUFFS INTO LUNGS TWO TIMES A DAY 10.6 g 11   No current facility-administered medications for this visit.     Medication Side Effects: None  Allergies: No Known Allergies  Past Medical History:  Diagnosis Date  . Allergy   . Anxiety   . Asthma   . Chicken pox   . Depression   . Hypertension   .  Insomnia   . Ulcerative colitis ([place])     Family History  Problem Relation Age of Onset  . Hypertension Mother   . Hypertension Father   . Depression Daughter   . Suicidality Grandchild   . Depression Other     Social History   Socioeconomic History  . Marital status: Single    Spouse name: Not on file  . Number of children: Not on file  . Years of education: Not on file  . Highest education level: Not on file  Occupational History  . Not on file  Social Needs  . Financial resource strain: Not on file  . Food insecurity:    Worry: Not on file    Inability: Not on  file  . Transportation needs:    Medical: Not on file    Non-medical: Not on file  Tobacco Use  . Smoking status: Never Smoker  . Smokeless tobacco: Never Used  Substance and Sexual Activity  . Alcohol use: Yes    Alcohol/week: 0.0 standard drinks    Comment: social  . Drug use: No  . Sexual activity: Not on file  Lifestyle  . Physical activity:    Days per week: Not on file    Minutes per session: Not on file  . Stress: Not on file  Relationships  . Social connections:    Talks on phone: Not on file    Gets together: Not on file    Attends religious service: Not on file    Active member of club or organization: Not on file    Attends meetings of clubs or organizations: Not on file    Relationship status: Not on file  . Intimate partner violence:    Fear of current or ex partner: Not on file    Emotionally abused: Not on file    Physically abused: Not on file    Forced sexual activity: Not on file  Other Topics Concern  . Not on file  Social History Narrative   Widowed. In a relationship.   Three children.   Recently moved from [place].   Enjoys working outdoors, sewing.    Past Medical History, Surgical history, Social history, and Family history were reviewed and updated as appropriate.   Please see review of systems for further details on the patient's review from today.   Objective:   Physical Exam:  BP 126/70   Pulse 65   Physical Exam Constitutional:      General: She is not in acute distress.    Appearance: She is well-developed.  Musculoskeletal:        General: No deformity.  Neurological:     Mental Status: She is alert and oriented to person, place, and time.     Coordination: Coordination normal.  Psychiatric:        Attention and Perception: Attention and perception normal. She does not perceive auditory or visual hallucinations.        Mood and Affect: Mood normal. Mood is not anxious or depressed. Affect is not labile, blunt, angry or  inappropriate.        Speech: Speech normal.        Behavior: Behavior normal.        Thought Content: Thought content normal. Thought content does not include homicidal or suicidal ideation. Thought content does not include homicidal or suicidal plan.        Cognition and Memory: Cognition and memory normal.        Judgment: Judgment  normal.     Comments: Insight intact. No delusions.      Lab Review:     Component Value Date/Time   NA 140 11/23/2015 1226   K 3.9 11/23/2015 1226   CL 104 11/23/2015 1226   CO2 28 11/23/2015 1226   GLUCOSE 97 11/23/2015 1226   BUN 18 11/23/2015 1226   CREATININE 0.86 11/23/2015 1226   CALCIUM 10.2 11/23/2015 1226   PROT 7.0 02/06/2015 1038   ALBUMIN 4.2 02/06/2015 1038   AST 16 02/06/2015 1038   ALT 11 02/06/2015 1038   ALKPHOS 117 02/06/2015 1038   BILITOT 0.8 02/06/2015 1038       Component Value Date/Time   WBC 7.5 06/30/2017 1018   RBC 4.32 06/30/2017 1018   HGB 13.3 06/30/2017 1018   HCT 40.2 06/30/2017 1018   PLT 363 06/30/2017 1018   MCV 93.1 06/30/2017 1018   MCH 30.7 06/30/2017 1018   MCHC 33.0 06/30/2017 1018   RDW 15.8 (H) 06/30/2017 1018   LYMPHSABS 1.3 06/30/2017 1018   MONOABS 0.6 06/30/2017 1018   EOSABS 0.2 06/30/2017 1018   BASOSABS 0.1 06/30/2017 1018    No results found for: POCLITH, LITHIUM   No results found for: PHENYTOIN, PHENOBARB, VALPROATE, CBMZ   .res Assessment: Plan:   Pt reports that she would like to try to decrease abilify back to 5 mg po qd since mood and anxiety have significantly improved. Will decrease Abilify to 5 mg po qd and advised pt to contact office if she has recurrence of anxiety or depression and Abilify can be increased to 10 mg po qd. Pt reports that she would like to switch to mail order in the future after picking up medications today at local pharmacy. She is unsure which mail order pharmacy she needs to use and will call office once she checks insurance information.   Continue  Cymbalta 90 mg po qd for depression and anxiety.  Continue Buspar 7.5 mg po BID for anxiety.   Patient advised to contact office with any questions, adverse effects, or acute worsening in signs and symptoms.   Generalized anxiety disorder - Plan: DULoxetine (CYMBALTA) 30 MG capsule, DULoxetine (CYMBALTA) 60 MG capsule  Primary insomnia  Recurrent major depressive disorder, in full remission ([place]) - Plan: ARIPiprazole (ABILIFY) 5 MG tablet, DULoxetine (CYMBALTA) 30 MG capsule, DULoxetine (CYMBALTA) 60 MG capsule  Please see After Visit Summary for patient specific instructions.  Future Appointments  Date Time Provider [place]  03/09/2019  1:00 PM Thayer Headings, PMHNP [facility] None    No orders of the defined types were placed in this encounter.     -------------------------------

## 2018-11-04 ENCOUNTER — Telehealth: Payer: Self-pay | Admitting: Primary Care

## 2018-11-04 DIAGNOSIS — J4540 Moderate persistent asthma, uncomplicated: Secondary | ICD-10-CM

## 2018-11-04 MED ORDER — ALBUTEROL SULFATE HFA 108 (90 BASE) MCG/ACT IN AERS: INHALATION_SPRAY | RESPIRATORY_TRACT | 0 refills | Status: DC

## 2018-11-04 NOTE — Telephone Encounter (Signed)
Katelyn Dillon (spouse) called to get refills on Albuterol  ventoline  [facility] [phone]  Pt is out of her meds

## 2018-11-04 NOTE — Telephone Encounter (Signed)
Refill as request and notified patient that she will need an appointment for any more refills

## 2018-12-23 ENCOUNTER — Other Ambulatory Visit: Payer: Self-pay | Admitting: Psychiatry

## 2018-12-23 DIAGNOSIS — F3342 Major depressive disorder, recurrent, in full remission: Secondary | ICD-10-CM

## 2019-01-05 ENCOUNTER — Ambulatory Visit (INDEPENDENT_AMBULATORY_CARE_PROVIDER_SITE_OTHER): Payer: Medicare Other | Admitting: Primary Care

## 2019-01-05 ENCOUNTER — Encounter: Payer: Self-pay | Admitting: Primary Care

## 2019-01-05 ENCOUNTER — Other Ambulatory Visit: Payer: Self-pay

## 2019-01-05 VITALS — BP 124/72 | HR 72 | Temp 98.0°F | Ht 62.0 in | Wt 128.8 lb

## 2019-01-05 DIAGNOSIS — F419 Anxiety disorder, unspecified: Secondary | ICD-10-CM | POA: Diagnosis not present

## 2019-01-05 DIAGNOSIS — F329 Major depressive disorder, single episode, unspecified: Secondary | ICD-10-CM | POA: Diagnosis not present

## 2019-01-05 DIAGNOSIS — J4540 Moderate persistent asthma, uncomplicated: Secondary | ICD-10-CM

## 2019-01-05 DIAGNOSIS — I10 Essential (primary) hypertension: Secondary | ICD-10-CM

## 2019-01-05 DIAGNOSIS — M542 Cervicalgia: Secondary | ICD-10-CM | POA: Diagnosis not present

## 2019-01-05 DIAGNOSIS — M62838 Other muscle spasm: Secondary | ICD-10-CM

## 2019-01-05 DIAGNOSIS — G8929 Other chronic pain: Secondary | ICD-10-CM

## 2019-01-05 DIAGNOSIS — F330 Major depressive disorder, recurrent, mild: Secondary | ICD-10-CM | POA: Diagnosis not present

## 2019-01-05 DIAGNOSIS — K5190 Ulcerative colitis, unspecified, without complications: Secondary | ICD-10-CM

## 2019-01-05 DIAGNOSIS — Z23 Encounter for immunization: Secondary | ICD-10-CM

## 2019-01-05 MED ORDER — TIZANIDINE HCL 4 MG PO TABS: 4.0000 mg | ORAL_TABLET | Freq: Two times a day (BID) | ORAL | 0 refills | Status: DC | PRN

## 2019-01-05 MED ORDER — ALBUTEROL SULFATE HFA 108 (90 BASE) MCG/ACT IN AERS: INHALATION_SPRAY | RESPIRATORY_TRACT | 0 refills | Status: AC

## 2019-01-05 MED ORDER — AMLODIPINE BESY-BENAZEPRIL HCL 10-20 MG PO CAPS: 1.0000 | ORAL_CAPSULE | Freq: Every day | ORAL | 1 refills | Status: DC

## 2019-01-05 MED ORDER — FLOVENT HFA 44 MCG/ACT IN AERO: INHALATION_SPRAY | RESPIRATORY_TRACT | 5 refills | Status: AC

## 2019-01-05 NOTE — Assessment & Plan Note (Signed)
Doing very well on her current regimen, following with psychiatry. Continue same.

## 2019-01-05 NOTE — Assessment & Plan Note (Signed)
No recent outbreaks. Following with GI.  Encouraged she schedule a follow up visit.

## 2019-01-05 NOTE — Assessment & Plan Note (Signed)
Overall stable, using tizanidine PRN. Refill provided.

## 2019-01-05 NOTE — Patient Instructions (Signed)
Stop by the lab prior to leaving today. I will notify you of your results once received.   You were provided with a pneumonia vaccination today.  Start exercising. You should be getting 150 minutes of exercise weekly.  It's important to improve your diet by reducing consumption of fast food, fried food, processed snack foods, sugary drinks. Increase consumption of fresh vegetables and fruits, whole grains, water.  Ensure you are drinking 64 ounces of water daily.  It was a pleasure to see you today!

## 2019-01-05 NOTE — Progress Notes (Signed)
Subjective:    Patient ID: Katelyn Dillon, female    DOB: Mar 08, 1941, 78 y.o.   MRN: [id number]  HPI  Katelyn Dillon is a 78 year old female who presents today for follow up of chronic conditions.    Immunizations: -Tetanus: Completed in 2017 -Influenza: Due this season  -Pneumonia: Unsure, never completed.  -Shingles: Completed in 2016  Colonoscopy: Completed in 2017 Mammogram: Declines  Dexa: Completed 2-3 years ago, declines today  BP Readings from Last 3 Encounters:  01/05/19 124/72  11/25/17 124/80  07/17/17 (!) 155/78     Review of Systems  Constitutional: Negative for unexpected weight change.  HENT: Negative for rhinorrhea.   Respiratory: Negative for cough and shortness of breath.   Cardiovascular: Negative for chest pain.  Gastrointestinal: Negative for constipation and diarrhea.  Genitourinary: Negative for difficulty urinating.  Musculoskeletal: Negative for arthralgias.       Chronic neck pain, no acute symptoms  Skin: Negative for rash.  Allergic/Immunologic: Negative for environmental allergies.  Neurological: Negative for dizziness, numbness and headaches.  Psychiatric/Behavioral:       Feels well managed per psychiatry.       Past Medical History:  Diagnosis Date  . Allergy   . Anxiety   . Asthma   . Chicken pox   . Depression   . Hypertension   . Insomnia   . Ulcerative colitis ()      Social History   Socioeconomic History  . Marital status: Married    Spouse name: Not on file  . Number of children: Not on file  . Years of education: Not on file  . Highest education level: Not on file  Occupational History  . Not on file  Social Needs  . Financial resource strain: Not on file  . Food insecurity    Worry: Not on file    Inability: Not on file  . Transportation needs    Medical: Not on file    Non-medical: Not on file  Tobacco Use  . Smoking status: Never Smoker  . Smokeless tobacco: Never Used  Substance and Sexual  Activity  . Alcohol use: Yes    Alcohol/week: 0.0 standard drinks    Comment: social  . Drug use: No  . Sexual activity: Not on file  Lifestyle  . Physical activity    Days per week: Not on file    Minutes per session: Not on file  . Stress: Not on file  Relationships  . Social Herbalist on phone: Not on file    Gets together: Not on file    Attends religious service: Not on file    Active member of club or organization: Not on file    Attends meetings of clubs or organizations: Not on file    Relationship status: Not on file  . Intimate partner violence    Fear of current or ex partner: Not on file    Emotionally abused: Not on file    Physically abused: Not on file    Forced sexual activity: Not on file  Other Topics Concern  . Not on file  Social History Narrative   Widowed. In a relationship.   Three children.   Recently moved from [place].   Enjoys working outdoors, sewing.    Past Surgical History:  Procedure Laterality Date  . ABDOMINAL HYSTERECTOMY  2000  . CATARACT EXTRACTION W/PHACO Right 06/19/2017   Procedure: CATARACT EXTRACTION PHACO AND INTRAOCULAR LENS PLACEMENT (IOC)-RIGHT;  Surgeon:  Eulogio Bear, MD;  Location: [facility] ORS;  Service: Ophthalmology;  Laterality: Right;  [place] 00:31.4 AP% 11.6 CDE 3.66 Fluid Pack lot # [id number]  . CATARACT EXTRACTION W/PHACO Left 07/17/2017   Procedure: CATARACT EXTRACTION PHACO AND INTRAOCULAR LENS PLACEMENT (IOC);  Surgeon: Eulogio Bear, MD;  Location: [facility] ORS;  Service: Ophthalmology;  Laterality: Left;  [place]   00:29.1 AP%   12.5 CDE   3.64 Fluid Pack Lot # [id number]  . COLONOSCOPY WITH PROPOFOL N/A 02/13/2016   Procedure: COLONOSCOPY WITH PROPOFOL;  Surgeon: Lollie Sails, MD;  Location[facility] ENDOSCOPY;  Service: Endoscopy;  Laterality: N/A;  . COLONOSCOPY WITH PROPOFOL N/A 05/31/2016   Procedure: COLONOSCOPY WITH PROPOFOL;  Surgeon: Lollie Sails, MD;  Location: [facility] ENDOSCOPY;  Service:  Endoscopy;  Laterality: N/A;  . JOINT REPLACEMENT Right 2013  . REPLACEMENT UNICONDYLAR JOINT KNEE      Family History  Problem Relation Age of Onset  . Hypertension Mother   . Hypertension Father   . Depression Daughter   . Suicidality Grandchild   . Depression Other     No Known Allergies  Current Outpatient Medications on File Prior to Visit  Medication Sig Dispense Refill  . ARIPiprazole (ABILIFY) 5 MG tablet TAKE 1 TABLET BY MOUTH EVERY DAY 30 tablet 3  . busPIRone (BUSPAR) 7.5 MG tablet take 1 tablet by mouth twice a day 180 tablet 1  . DULoxetine (CYMBALTA) 30 MG capsule TAKE 1 CAPSULE BY MOUTH DAILY. TAKE WITH 60 MG CAPSULE TO EQUAL 90 MG. 90 capsule 0  . DULoxetine (CYMBALTA) 60 MG capsule Take 1 capsule (60 mg total) by mouth daily. Take with 30 mg capsule to equal 90 mg 90 capsule 0  . mercaptopurine (PURINETHOL) 50 MG tablet Take 1 tablet by mouth daily.  1   No current facility-administered medications on file prior to visit.     BP 124/72   Pulse 72   Temp 98 F (36.7 C) (Temporal)   Ht 5' 2"  (1.575 m)   Wt 128 lb 12 oz (58.4 kg)   SpO2 96%   BMI 23.55 kg/m    Objective:   Physical Exam  Constitutional: She is oriented to person, place, and time. She appears well-nourished.  HENT:  Mouth/Throat: No oropharyngeal exudate.  Eyes: Pupils are equal, round, and reactive to light. EOM are normal.  Neck: Neck supple.  Cardiovascular: Normal rate and regular rhythm.  Respiratory: Effort normal and breath sounds normal.  GI: Soft. Bowel sounds are normal. There is no abdominal tenderness.  Musculoskeletal: Normal range of motion.  Neurological: She is alert and oriented to person, place, and time.  Skin: Skin is warm and dry.  Psychiatric: She has a normal mood and affect.           Assessment & Plan:

## 2019-01-05 NOTE — Assessment & Plan Note (Signed)
Doing very well on current regimen, following with psychiatry. Continue same.

## 2019-01-05 NOTE — Assessment & Plan Note (Signed)
Doing well on Flovent and is compliant daily. Using albuterol PRN and sparingly.

## 2019-01-05 NOTE — Assessment & Plan Note (Addendum)
Stable in the office today, continue amlodipine-benazepril. BMP pending.

## 2019-01-05 NOTE — Addendum Note (Signed)
Addended by: Jacqualin Combes on: 01/05/2019 04:25 PM   Modules accepted: Orders

## 2019-01-06 LAB — COMPREHENSIVE METABOLIC PANEL
ALT: 12 U/L (ref 0–35)
AST: 19 U/L (ref 0–37)
Albumin: 4.6 g/dL (ref 3.5–5.2)
Alkaline Phosphatase: 123 U/L — ABNORMAL HIGH (ref 39–117)
BUN: 20 mg/dL (ref 6–23)
CO2: 29 mEq/L (ref 19–32)
Calcium: 9.9 mg/dL (ref 8.4–10.5)
Chloride: 103 mEq/L (ref 96–112)
Creatinine, Ser: 0.78 mg/dL (ref 0.40–1.20)
GFR: 71.75 mL/min (ref >60.00)
Glucose, Bld: 89 mg/dL (ref 70–99)
Potassium: 4 mEq/L (ref 3.5–5.1)
Sodium: 140 mEq/L (ref 135–145)
Total Bilirubin: 0.9 mg/dL (ref 0.2–1.2)
Total Protein: 7.1 g/dL (ref 6.0–8.3)

## 2019-01-06 LAB — LIPID PANEL
Cholesterol: 219 mg/dL — ABNORMAL HIGH (ref 0–200)
HDL: 81 mg/dL (ref >39.00)
LDL Cholesterol: 121 mg/dL — ABNORMAL HIGH (ref 0–99)
NonHDL: 137.95
Total CHOL/HDL Ratio: 3
Triglycerides: 86 mg/dL (ref 0.0–149.0)
VLDL: 17.2 mg/dL (ref 0.0–40.0)

## 2019-01-08 DIAGNOSIS — E785 Hyperlipidemia, unspecified: Secondary | ICD-10-CM

## 2019-01-08 MED ORDER — ATORVASTATIN CALCIUM 20 MG PO TABS: 20.0000 mg | ORAL_TABLET | Freq: Every day | ORAL | 0 refills | Status: DC

## 2019-01-16 ENCOUNTER — Other Ambulatory Visit: Payer: Self-pay | Admitting: Psychiatry

## 2019-01-16 DIAGNOSIS — F3342 Major depressive disorder, recurrent, in full remission: Secondary | ICD-10-CM

## 2019-01-25 ENCOUNTER — Other Ambulatory Visit: Payer: Self-pay | Admitting: Psychiatry

## 2019-01-25 DIAGNOSIS — F329 Major depressive disorder, single episode, unspecified: Secondary | ICD-10-CM

## 2019-01-25 DIAGNOSIS — F32A Depression, unspecified: Secondary | ICD-10-CM

## 2019-01-28 ENCOUNTER — Other Ambulatory Visit: Payer: Self-pay | Admitting: Primary Care

## 2019-01-28 DIAGNOSIS — M62838 Other muscle spasm: Secondary | ICD-10-CM

## 2019-03-09 ENCOUNTER — Ambulatory Visit: Payer: Medicare Other | Admitting: Psychiatry

## 2019-03-15 ENCOUNTER — Other Ambulatory Visit: Payer: Self-pay

## 2019-03-15 ENCOUNTER — Ambulatory Visit (INDEPENDENT_AMBULATORY_CARE_PROVIDER_SITE_OTHER): Payer: Medicare Other | Admitting: Psychiatry

## 2019-03-15 ENCOUNTER — Encounter: Payer: Self-pay | Admitting: Psychiatry

## 2019-03-15 VITALS — BP 145/86 | HR 72

## 2019-03-15 DIAGNOSIS — F332 Major depressive disorder, recurrent severe without psychotic features: Secondary | ICD-10-CM

## 2019-03-15 DIAGNOSIS — F5101 Primary insomnia: Secondary | ICD-10-CM | POA: Diagnosis not present

## 2019-03-15 DIAGNOSIS — F411 Generalized anxiety disorder: Secondary | ICD-10-CM

## 2019-03-15 MED ORDER — DESVENLAFAXINE SUCCINATE ER 50 MG PO TB24: 50.0000 mg | ORAL_TABLET | Freq: Every day | ORAL | 1 refills | Status: DC

## 2019-03-15 MED ORDER — MIRTAZAPINE 15 MG PO TABS: ORAL_TABLET | ORAL | 1 refills | Status: DC

## 2019-03-15 NOTE — Progress Notes (Signed)
Katelyn Dillon [id number] 1940-10-18 78 y.o.  Subjective:   Patient ID:  Katelyn Dillon is a 78 y.o. (DOB 1941-05-20) female.  Chief Complaint:  Chief Complaint  Patient presents with  . Depression  . Anxiety    HPI Katelyn Dillon presents to the office today for follow-up of depression and anxiety. She reports that her mood is currently very depressed- "I am in the gutter." She reports that her depression started about 3 months ago and is progressively worsening. She reports that her anxiety has also been increased. Having anxiety with driving on the highway. She reports that she may have some irritability. Low energy and low motivation- "all I want to do is lay around... I do what I have to do." Falling behind on things around the house. Reports fragmented sleep and often sleeping 2-3 hours consecutively and then awakening. Reports sleep has been unimproved with OTC sleep aids. She reports decreased appetite and interest in food- "I eat because I know I need to." She reports impaired concentration and has been unable to focus on a TV program. Reports feelings of worthlessness. Anhedonia. Denies SI.   Reports that she ran out of Cymbalta months ago and did not refill it since she did not notice any improvement.   Reports that she has been isolated with now living in a more remote location. Sister-in-law died this weekend. Best friend just dx'd with dementa  Past medication trials: Paxil-initially helpful than ineffective Zoloft Effexor may have been helpful during the past BuSpar Wellbutrin-disturbing dreams Cymbalta- Initially helpful then had recurrent depression. No improvement with increase to 90 mg. Abilify Ativan  Review of Systems:  Review of Systems  Musculoskeletal: Negative for gait problem.  Neurological: Negative for tremors.  Psychiatric/Behavioral:       Please refer to HPI    Medications: I have reviewed the patient's current medications.  Current Outpatient  Medications  Medication Sig Dispense Refill  . albuterol (VENTOLIN HFA) 108 (90 Base) MCG/ACT inhaler INHALE 2 PUFFS INTO THE LUNGS EVERY 4 (FOUR) HOURS AS NEEDED FOR WHEEZING OR SHORTNESS OF BREATH. 18 g 0  . amLODipine-benazepril (LOTREL) 10-20 MG capsule Take 1 capsule by mouth daily. For blood pressure. 90 capsule 1  . ARIPiprazole (ABILIFY) 5 MG tablet TAKE 1 TABLET BY MOUTH EVERY DAY 90 tablet 1  . atorvastatin (LIPITOR) 20 MG tablet Take 1 tablet (20 mg total) by mouth daily. For cholesterol. 90 tablet 0  . busPIRone (BUSPAR) 7.5 MG tablet Take 1 tablet (7.5 mg total) by mouth 2 (two) times daily. 180 tablet 0  . fluticasone (FLOVENT HFA) 44 MCG/ACT inhaler INHALE 2 PUFFS INTO LUNGS TWO TIMES A DAY 10.6 g 5  . mercaptopurine (PURINETHOL) 50 MG tablet Take 1 tablet by mouth daily.  1  . tiZANidine (ZANAFLEX) 4 MG tablet Take 1 tablet (4 mg total) by mouth 2 (two) times daily as needed for muscle spasms. 60 tablet 0  . desvenlafaxine (PRISTIQ) 50 MG 24 hr tablet Take 1 tablet (50 mg total) by mouth daily. 30 tablet 1  . DULoxetine (CYMBALTA) 30 MG capsule TAKE 1 CAPSULE BY MOUTH DAILY. TAKE WITH 60 MG CAPSULE TO EQUAL 90 MG. (Patient not taking: Reported on 03/15/2019) 90 capsule 0  . DULoxetine (CYMBALTA) 60 MG capsule Take 1 capsule (60 mg total) by mouth daily. Take with 30 mg capsule to equal 90 mg (Patient not taking: Reported on 03/15/2019) 90 capsule 0  . mirtazapine (REMERON) 15 MG tablet Take 1/2-1 tab  po QHS 30 tablet 1   No current facility-administered medications for this visit.     Medication Side Effects: None  Allergies: No Known Allergies  Past Medical History:  Diagnosis Date  . Allergy   . Anxiety   . Asthma   . Chicken pox   . Depression   . Hypertension   . Insomnia   . Ulcerative colitis ([place])     Family History  Problem Relation Age of Onset  . Hypertension Mother   . Hypertension Father   . Depression Daughter   . Suicidality Grandchild   .  Depression Other     Social History   Socioeconomic History  . Marital status: Married    Spouse name: Not on file  . Number of children: Not on file  . Years of education: Not on file  . Highest education level: Not on file  Occupational History  . Not on file  Social Needs  . Financial resource strain: Not on file  . Food insecurity    Worry: Not on file    Inability: Not on file  . Transportation needs    Medical: Not on file    Non-medical: Not on file  Tobacco Use  . Smoking status: Never Smoker  . Smokeless tobacco: Never Used  Substance and Sexual Activity  . Alcohol use: Yes    Alcohol/week: 0.0 standard drinks    Comment: social  . Drug use: No  . Sexual activity: Not on file  Lifestyle  . Physical activity    Days per week: Not on file    Minutes per session: Not on file  . Stress: Not on file  Relationships  . Social Herbalist on phone: Not on file    Gets together: Not on file    Attends religious service: Not on file    Active member of club or organization: Not on file    Attends meetings of clubs or organizations: Not on file    Relationship status: Not on file  . Intimate partner violence    Fear of current or ex partner: Not on file    Emotionally abused: Not on file    Physically abused: Not on file    Forced sexual activity: Not on file  Other Topics Concern  . Not on file  Social History Narrative   Widowed. In a relationship.   Three children.   Recently moved from [place].   Enjoys working outdoors, sewing.    Past Medical History, Surgical history, Social history, and Family history were reviewed and updated as appropriate.   Please see review of systems for further details on the patient's review from today.   Objective:   Physical Exam:  BP (!) 145/86   Pulse 72   Physical Exam Constitutional:      General: She is not in acute distress.    Appearance: She is well-developed.  Musculoskeletal:        General: No  deformity.  Neurological:     Mental Status: She is alert and oriented to person, place, and time.     Coordination: Coordination normal.  Psychiatric:        Attention and Perception: Attention and perception normal. She does not perceive auditory or visual hallucinations.        Mood and Affect: Mood is anxious and depressed. Affect is blunt. Affect is not labile, angry or inappropriate.        Speech: Speech normal.  Behavior: Behavior normal.        Thought Content: Thought content normal. Thought content does not include homicidal or suicidal ideation. Thought content does not include homicidal or suicidal plan.        Cognition and Memory: Cognition and memory normal.        Judgment: Judgment normal.     Comments: Insight intact. No delusions.      Lab Review:     Component Value Date/Time   NA 140 01/05/2019 1606   K 4.0 01/05/2019 1606   CL 103 01/05/2019 1606   CO2 29 01/05/2019 1606   GLUCOSE 89 01/05/2019 1606   BUN 20 01/05/2019 1606   CREATININE 0.78 01/05/2019 1606   CALCIUM 9.9 01/05/2019 1606   PROT 7.1 01/05/2019 1606   ALBUMIN 4.6 01/05/2019 1606   AST 19 01/05/2019 1606   ALT 12 01/05/2019 1606   ALKPHOS 123 (H) 01/05/2019 1606   BILITOT 0.9 01/05/2019 1606       Component Value Date/Time   WBC 7.5 06/30/2017 1018   RBC 4.32 06/30/2017 1018   HGB 13.3 06/30/2017 1018   HCT 40.2 06/30/2017 1018   PLT 363 06/30/2017 1018   MCV 93.1 06/30/2017 1018   MCH 30.7 06/30/2017 1018   MCHC 33.0 06/30/2017 1018   RDW 15.8 (H) 06/30/2017 1018   LYMPHSABS 1.3 06/30/2017 1018   MONOABS 0.6 06/30/2017 1018   EOSABS 0.2 06/30/2017 1018   BASOSABS 0.1 06/30/2017 1018    No results found for: POCLITH, LITHIUM   No results found for: PHENYTOIN, PHENOBARB, VALPROATE, CBMZ   .res Assessment: Plan:   Discussed potential benefits, risks, and side effects of Pristiq and mirtazapine.  Patient agrees to trial of Pristiq for depression and Remeron to help with  insomnia, low appetite, mood, and anxiety. We will start Pristiq 50 mg p.o. every morning for depression. Will start Remeron 15 mg 1/2-1 tab p.o. nightly. Patient reports that she has appointment to see PCP in her new geographical area next month and may consider switching her psychiatric care to a provider closer to home since she has moved and is now 2 to 2-1/2 hours away from this office.  Discussed that medications could be bridged if needed to avoid any lapse in treatment before starting with new provider.  Discussed that records could be provided with signed information release. Patient to follow-up in 2 months or sooner if clinically indicated.   Patient advised to contact office with any questions, adverse effects, or acute worsening in signs and symptoms.  Katelyn Dillon was seen today for depression and anxiety.  Diagnoses and all orders for this visit:  Generalized anxiety disorder  Primary insomnia -     mirtazapine (REMERON) 15 MG tablet; Take 1/2-1 tab po QHS  Severe episode of recurrent major depressive disorder, without psychotic features (HCC) -     desvenlafaxine (PRISTIQ) 50 MG 24 hr tablet; Take 1 tablet (50 mg total) by mouth daily. -     mirtazapine (REMERON) 15 MG tablet; Take 1/2-1 tab po QHS     Please see After Visit Summary for patient specific instructions.  Future Appointments  Date Time Provider [place]  05/17/2019  1:30 PM Thayer Headings, PMHNP [facility] None    No orders of the defined types were placed in this encounter.   -------------------------------

## 2019-03-15 NOTE — Progress Notes (Signed)
   03/15/19 1318  Facial and Oral Movements  Muscles of Facial Expression 0  Lips and Perioral Area 0  Jaw 0  Tongue 0  Extremity Movements  Upper (arms, wrists, hands, fingers) 0  Lower (legs, knees, ankles, toes) 0  Trunk Movements  Neck, shoulders, hips 0  Overall Severity  Severity of abnormal movements (highest score from questions above) 0  Patient's awareness of abnormal movements (rate only patient's report) 0

## 2019-04-05 ENCOUNTER — Other Ambulatory Visit: Payer: Self-pay | Admitting: Primary Care

## 2019-04-05 DIAGNOSIS — E785 Hyperlipidemia, unspecified: Secondary | ICD-10-CM

## 2019-04-06 ENCOUNTER — Other Ambulatory Visit: Payer: Self-pay | Admitting: Psychiatry

## 2019-04-06 DIAGNOSIS — F332 Major depressive disorder, recurrent severe without psychotic features: Secondary | ICD-10-CM

## 2019-04-06 DIAGNOSIS — F5101 Primary insomnia: Secondary | ICD-10-CM

## 2019-04-07 ENCOUNTER — Other Ambulatory Visit: Payer: Self-pay | Admitting: Psychiatry

## 2019-04-07 DIAGNOSIS — F332 Major depressive disorder, recurrent severe without psychotic features: Secondary | ICD-10-CM

## 2019-04-13 DIAGNOSIS — E785 Hyperlipidemia, unspecified: Secondary | ICD-10-CM | POA: Diagnosis not present

## 2019-04-13 DIAGNOSIS — Z7689 Persons encountering health services in other specified circumstances: Secondary | ICD-10-CM | POA: Diagnosis not present

## 2019-04-13 DIAGNOSIS — J45909 Unspecified asthma, uncomplicated: Secondary | ICD-10-CM | POA: Diagnosis not present

## 2019-04-13 DIAGNOSIS — Z79899 Other long term (current) drug therapy: Secondary | ICD-10-CM | POA: Diagnosis not present

## 2019-04-13 DIAGNOSIS — F331 Major depressive disorder, recurrent, moderate: Secondary | ICD-10-CM | POA: Diagnosis not present

## 2019-04-13 DIAGNOSIS — I10 Essential (primary) hypertension: Secondary | ICD-10-CM | POA: Diagnosis not present

## 2019-04-13 DIAGNOSIS — Z23 Encounter for immunization: Secondary | ICD-10-CM | POA: Diagnosis not present

## 2019-04-13 DIAGNOSIS — K5190 Ulcerative colitis, unspecified, without complications: Secondary | ICD-10-CM | POA: Diagnosis not present

## 2019-04-19 ENCOUNTER — Other Ambulatory Visit: Payer: Self-pay | Admitting: Psychiatry

## 2019-04-19 DIAGNOSIS — F329 Major depressive disorder, single episode, unspecified: Secondary | ICD-10-CM

## 2019-04-19 DIAGNOSIS — F419 Anxiety disorder, unspecified: Secondary | ICD-10-CM

## 2019-04-22 DIAGNOSIS — K5190 Ulcerative colitis, unspecified, without complications: Secondary | ICD-10-CM | POA: Diagnosis not present

## 2019-05-17 ENCOUNTER — Other Ambulatory Visit: Payer: Self-pay

## 2019-05-17 ENCOUNTER — Encounter: Payer: Self-pay | Admitting: Psychiatry

## 2019-05-17 ENCOUNTER — Ambulatory Visit (INDEPENDENT_AMBULATORY_CARE_PROVIDER_SITE_OTHER): Payer: Medicare Other | Admitting: Psychiatry

## 2019-05-17 VITALS — BP 142/66 | HR 68

## 2019-05-17 DIAGNOSIS — F330 Major depressive disorder, recurrent, mild: Secondary | ICD-10-CM

## 2019-05-17 DIAGNOSIS — F5101 Primary insomnia: Secondary | ICD-10-CM

## 2019-05-17 DIAGNOSIS — F411 Generalized anxiety disorder: Secondary | ICD-10-CM | POA: Diagnosis not present

## 2019-05-17 MED ORDER — DESVENLAFAXINE SUCCINATE ER 100 MG PO TB24: 50.0000 mg | ORAL_TABLET | Freq: Every day | ORAL | 0 refills | Status: DC

## 2019-05-17 NOTE — Progress Notes (Signed)
SINDA LEEDOM [id number] 1940/08/29 78 y.o.  Subjective:   Patient ID:  Katelyn Dillon is a 78 y.o. (DOB 12/31/1940) female.  Chief Complaint:  Chief Complaint  Patient presents with  . Follow-up    h/o Depression and Anxiety    HPI Katelyn Dillon presents to the office today for follow-up of depression and anxiety.   "I feel better than I have in a long time." She reports improved energy and motivation. She reports that she continues to have some mild depression. She reports that one friend is in hospice and another friend was dx'd with breast cancer. She reports anxiety "comes and goes." She reports that she is sleeping ok and sleeps vary. She reports that she is taking Remeron 1/2 tab po QHS prn since she prefers not to take them every night. She reports that her appetite has been good and has gained some weight. Concentration has improved some but remains impaired. Denies SI.   Reports that she typically has more difficulty around the holidays. Son will be visiting from [facility] for the holidays and daughter and her family will also be coming.  Past medication trials: Paxil-initially helpful than ineffective Zoloft Effexor may have been helpful during the past BuSpar Wellbutrin-disturbing dreams Cymbalta- Initially helpful then had recurrent depression. No improvement with increase to 90 mg. Abilify Ativan  Review of Systems:  Review of Systems  Musculoskeletal: Negative for gait problem.  Neurological: Negative for tremors and headaches.  Psychiatric/Behavioral:       Please refer to HPI    Medications: I have reviewed the patient's current medications.the holidays.    Current Outpatient Medications  Medication Sig Dispense Refill  . albuterol (VENTOLIN HFA) 108 (90 Base) MCG/ACT inhaler INHALE 2 PUFFS INTO THE LUNGS EVERY 4 (FOUR) HOURS AS NEEDED FOR WHEEZING OR SHORTNESS OF BREATH. 18 g 0  . amLODipine-benazepril (LOTREL) 10-20 MG capsule Take 1 capsule by mouth  daily. For blood pressure. 90 capsule 1  . atorvastatin (LIPITOR) 20 MG tablet TAKE 1 TABLET BY MOUTH DAILY. FOR CHOLESTEROL. 90 tablet 0  . desvenlafaxine (PRISTIQ) 100 MG 24 hr tablet Take 1 tablet (100 mg total) by mouth daily. 90 tablet 0  . mercaptopurine (PURINETHOL) 50 MG tablet Take 1 tablet by mouth daily.  1  . mirtazapine (REMERON) 15 MG tablet TAKE 1/2-1 TABLET BY MOUTH AT BEDTIME 90 tablet 1  . tiZANidine (ZANAFLEX) 4 MG tablet Take 1 tablet (4 mg total) by mouth 2 (two) times daily as needed for muscle spasms. 60 tablet 0  . fluticasone (FLOVENT HFA) 44 MCG/ACT inhaler INHALE 2 PUFFS INTO LUNGS TWO TIMES A DAY (Patient not taking: Reported on 05/17/2019) 10.6 g 5   No current facility-administered medications for this visit.     Medication Side Effects: None  Allergies: No Known Allergies  Past Medical History:  Diagnosis Date  . Allergy   . Anxiety   . Asthma   . Chicken pox   . Depression   . Hypertension   . Insomnia   . Ulcerative colitis ([place])     Family History  Problem Relation Age of Onset  . Hypertension Mother   . Hypertension Father   . Depression Daughter   . Suicidality Grandchild   . Depression Other     Social History   Socioeconomic History  . Marital status: Married    Spouse name: Not on file  . Number of children: Not on file  . Years of education: Not on file  .  Highest education level: Not on file  Occupational History  . Not on file  Social Needs  . Financial resource strain: Not on file  . Food insecurity    Worry: Not on file    Inability: Not on file  . Transportation needs    Medical: Not on file    Non-medical: Not on file  Tobacco Use  . Smoking status: Never Smoker  . Smokeless tobacco: Never Used  Substance and Sexual Activity  . Alcohol use: Yes    Alcohol/week: 0.0 standard drinks    Comment: social  . Drug use: No  . Sexual activity: Not on file  Lifestyle  . Physical activity    Days per week: Not on file     Minutes per session: Not on file  . Stress: Not on file  Relationships  . Social Herbalist on phone: Not on file    Gets together: Not on file    Attends religious service: Not on file    Active member of club or organization: Not on file    Attends meetings of clubs or organizations: Not on file    Relationship status: Not on file  . Intimate partner violence    Fear of current or ex partner: Not on file    Emotionally abused: Not on file    Physically abused: Not on file    Forced sexual activity: Not on file  Other Topics Concern  . Not on file  Social History Narrative   Widowed. In a relationship.   Three children.   Recently moved from [place].   Enjoys working outdoors, sewing.    Past Medical History, Surgical history, Social history, and Family history were reviewed and updated as appropriate.   Please see review of systems for further details on the patient's review from today.   Objective:   Physical Exam:  BP (!) 142/66   Pulse 68   Physical Exam Constitutional:      General: She is not in acute distress.    Appearance: She is well-developed.  Musculoskeletal:        General: No deformity.  Neurological:     Mental Status: She is alert and oriented to person, place, and time.     Coordination: Coordination normal.  Psychiatric:        Attention and Perception: Attention and perception normal. She does not perceive auditory or visual hallucinations.        Mood and Affect: Affect is not labile, blunt, angry or inappropriate.        Speech: Speech normal.        Behavior: Behavior normal.        Thought Content: Thought content normal. Thought content is not paranoid or delusional. Thought content does not include homicidal or suicidal ideation. Thought content does not include homicidal or suicidal plan.        Cognition and Memory: Cognition and memory normal.        Judgment: Judgment normal.     Comments: Mood presents as less anxious and  less depressed compared to last visit. Insight intact. No delusions.      Lab Review:     Component Value Date/Time   NA 140 01/05/2019 1606   K 4.0 01/05/2019 1606   CL 103 01/05/2019 1606   CO2 29 01/05/2019 1606   GLUCOSE 89 01/05/2019 1606   BUN 20 01/05/2019 1606   CREATININE 0.78 01/05/2019 1606   CALCIUM 9.9 01/05/2019 1606  PROT 7.1 01/05/2019 1606   ALBUMIN 4.6 01/05/2019 1606   AST 19 01/05/2019 1606   ALT 12 01/05/2019 1606   ALKPHOS 123 (H) 01/05/2019 1606   BILITOT 0.9 01/05/2019 1606       Component Value Date/Time   WBC 7.5 06/30/2017 1018   RBC 4.32 06/30/2017 1018   HGB 13.3 06/30/2017 1018   HCT 40.2 06/30/2017 1018   PLT 363 06/30/2017 1018   MCV 93.1 06/30/2017 1018   MCH 30.7 06/30/2017 1018   MCHC 33.0 06/30/2017 1018   RDW 15.8 (H) 06/30/2017 1018   LYMPHSABS 1.3 06/30/2017 1018   MONOABS 0.6 06/30/2017 1018   EOSABS 0.2 06/30/2017 1018   BASOSABS 0.1 06/30/2017 1018    No results found for: POCLITH, LITHIUM   No results found for: PHENYTOIN, PHENOBARB, VALPROATE, CBMZ   .res Assessment: Plan:   Discussed potential benefits, risks, and side effects of increasing Pristiq to 100 mg daily to further improve mood and anxiety signs and symptoms since patient reports having a significant improvement in mood and anxiety signs and symptoms with Pristiq 50 mg daily and continues to have some mild residual mood and anxiety signs and symptoms.  Patient agrees to increase in Pristiq to 100 mg daily. Patient reports that she plans to transition care to a new provider since she has relocated and is now 2 to 3 hours away geographically.  Reviewed list of possible psychiatry providers given to her by her new PCP.  Discussed that records could be provided to new psychiatric provider once signed release of information is received.  Patient reports that she will tentatively schedule follow-up appointment in 6 months in the event that she is unable to establish  care with a new psychiatric provider right away. Patient advised to contact office with any questions, adverse effects, or acute worsening in signs and symptoms.  [place] was seen today for follow-up.  Diagnoses and all orders for this visit:  Generalized anxiety disorder  Mild episode of recurrent major depressive disorder (HCC) -     desvenlafaxine (PRISTIQ) 100 MG 24 hr tablet; Take 1 tablet (100 mg total) by mouth daily.  Primary insomnia     Please see After Visit Summary for patient specific instructions.  Future Appointments  Date Time Provider [place]  11/15/2019  1:15 PM Thayer Headings, PMHNP CP-[facility] None    No orders of the defined types were placed in this encounter.   -------------------------------

## 2019-05-24 ENCOUNTER — Other Ambulatory Visit: Payer: Self-pay

## 2019-06-20 ENCOUNTER — Other Ambulatory Visit: Payer: Self-pay | Admitting: Primary Care

## 2019-06-20 DIAGNOSIS — I10 Essential (primary) hypertension: Secondary | ICD-10-CM

## 2019-07-25 ENCOUNTER — Other Ambulatory Visit: Payer: Self-pay | Admitting: Primary Care

## 2019-07-25 DIAGNOSIS — M62838 Other muscle spasm: Secondary | ICD-10-CM

## 2019-08-09 ENCOUNTER — Other Ambulatory Visit: Payer: Self-pay | Admitting: Psychiatry

## 2019-08-09 DIAGNOSIS — F330 Major depressive disorder, recurrent, mild: Secondary | ICD-10-CM

## 2019-08-28 ENCOUNTER — Other Ambulatory Visit: Payer: Self-pay | Admitting: Primary Care

## 2019-08-28 DIAGNOSIS — M62838 Other muscle spasm: Secondary | ICD-10-CM

## 2019-08-28 DIAGNOSIS — I10 Essential (primary) hypertension: Secondary | ICD-10-CM

## 2019-08-30 NOTE — Telephone Encounter (Signed)
tiZANidine (ZANAFLEX) 4 MG tablet 60 tablet   Last filled 07/26/2019, please advise

## 2019-08-30 NOTE — Telephone Encounter (Signed)
Defer to pcp for tizanidine refill.

## 2019-10-01 ENCOUNTER — Other Ambulatory Visit: Payer: Self-pay | Admitting: Family Medicine

## 2019-10-01 DIAGNOSIS — I10 Essential (primary) hypertension: Secondary | ICD-10-CM

## 2019-10-01 DIAGNOSIS — M62838 Other muscle spasm: Secondary | ICD-10-CM

## 2019-10-16 ENCOUNTER — Other Ambulatory Visit: Payer: Self-pay | Admitting: Psychiatry

## 2019-10-16 DIAGNOSIS — F332 Major depressive disorder, recurrent severe without psychotic features: Secondary | ICD-10-CM

## 2019-10-16 DIAGNOSIS — F5101 Primary insomnia: Secondary | ICD-10-CM

## 2019-11-04 ENCOUNTER — Other Ambulatory Visit: Payer: Self-pay | Admitting: Family Medicine

## 2019-11-04 DIAGNOSIS — I10 Essential (primary) hypertension: Secondary | ICD-10-CM

## 2019-11-04 DIAGNOSIS — M62838 Other muscle spasm: Secondary | ICD-10-CM

## 2019-11-15 ENCOUNTER — Ambulatory Visit: Payer: Medicare Other | Admitting: Psychiatry

## 2019-11-28 ENCOUNTER — Other Ambulatory Visit: Payer: Self-pay | Admitting: Internal Medicine

## 2019-11-28 DIAGNOSIS — I10 Essential (primary) hypertension: Secondary | ICD-10-CM

## 2019-11-28 DIAGNOSIS — M62838 Other muscle spasm: Secondary | ICD-10-CM

## 2019-12-01 ENCOUNTER — Telehealth: Payer: Self-pay | Admitting: Primary Care

## 2019-12-01 NOTE — Telephone Encounter (Signed)
Left message for patient to call back and schedule Medicare Annual Wellness Visit (AWV) with Nurse Health Advisor   This should be a telephone visit only.  Last AWV 02/15/15

## 2019-12-02 NOTE — Telephone Encounter (Signed)
Message left for patient to return my call.  

## 2019-12-09 ENCOUNTER — Other Ambulatory Visit: Payer: Self-pay | Admitting: Internal Medicine

## 2019-12-09 DIAGNOSIS — I10 Essential (primary) hypertension: Secondary | ICD-10-CM

## 2019-12-10 NOTE — Telephone Encounter (Signed)
Per Allie Bossier, patient had already establish care in [place].

## 2019-12-10 NOTE — Telephone Encounter (Signed)
Per DPR, left detail message for patient to return phone. Need to know if patient have establish care with a new provider yet.

## 2019-12-26 ENCOUNTER — Other Ambulatory Visit: Payer: Self-pay | Admitting: Internal Medicine

## 2019-12-26 DIAGNOSIS — I10 Essential (primary) hypertension: Secondary | ICD-10-CM

## 2020-01-06 ENCOUNTER — Other Ambulatory Visit: Payer: Self-pay | Admitting: Internal Medicine

## 2020-01-06 DIAGNOSIS — M62838 Other muscle spasm: Secondary | ICD-10-CM

## 2020-02-13 ENCOUNTER — Other Ambulatory Visit: Payer: Self-pay | Admitting: Psychiatry

## 2020-02-13 DIAGNOSIS — F330 Major depressive disorder, recurrent, mild: Secondary | ICD-10-CM

## 2020-02-14 NOTE — Telephone Encounter (Signed)
I believe she relocated

## 2020-05-29 ENCOUNTER — Other Ambulatory Visit: Payer: Self-pay | Admitting: Internal Medicine

## 2020-05-29 ENCOUNTER — Other Ambulatory Visit: Payer: Self-pay | Admitting: Psychiatry

## 2020-05-29 DIAGNOSIS — M62838 Other muscle spasm: Secondary | ICD-10-CM

## 2020-05-29 DIAGNOSIS — F330 Major depressive disorder, recurrent, mild: Secondary | ICD-10-CM

## 2020-05-30 NOTE — Telephone Encounter (Signed)
This was last filled by [facility] 10/2019...Marland Kitchen please advise, did see in [place] note from 12/2018 for pt to continue PRN... please advise

## 2020-06-02 NOTE — Telephone Encounter (Signed)
Last apt 05/2019

## 2020-08-11 ENCOUNTER — Other Ambulatory Visit: Payer: Self-pay | Admitting: Psychiatry

## 2020-08-11 DIAGNOSIS — F330 Major depressive disorder, recurrent, mild: Secondary | ICD-10-CM
